# Patient Record
Sex: Female | Born: 1955 | Race: White | Hispanic: No | Marital: Married | Smoking: Former smoker
Health system: Southern US, Academic
[De-identification: ages and names within clinical notes are randomized; demographics above are authoritative.]

## PROBLEM LIST (undated history)

## (undated) DIAGNOSIS — J189 Pneumonia, unspecified organism: Secondary | ICD-10-CM

## (undated) DIAGNOSIS — Z889 Allergy status to unspecified drugs, medicaments and biological substances status: Secondary | ICD-10-CM

## (undated) DIAGNOSIS — E079 Disorder of thyroid, unspecified: Secondary | ICD-10-CM

## (undated) DIAGNOSIS — D242 Benign neoplasm of left breast: Secondary | ICD-10-CM

## (undated) DIAGNOSIS — H6690 Otitis media, unspecified, unspecified ear: Secondary | ICD-10-CM

## (undated) DIAGNOSIS — I7300 Raynaud's syndrome without gangrene: Secondary | ICD-10-CM

## (undated) DIAGNOSIS — E063 Autoimmune thyroiditis: Secondary | ICD-10-CM

## (undated) DIAGNOSIS — D8940 Mast cell activation, unspecified: Secondary | ICD-10-CM

## (undated) DIAGNOSIS — K5730 Diverticulosis of large intestine without perforation or abscess without bleeding: Secondary | ICD-10-CM

## (undated) DIAGNOSIS — L511 Stevens-Johnson syndrome: Secondary | ICD-10-CM

## (undated) DIAGNOSIS — J309 Allergic rhinitis, unspecified: Secondary | ICD-10-CM

## (undated) DIAGNOSIS — Z8709 Personal history of other diseases of the respiratory system: Secondary | ICD-10-CM

## (undated) DIAGNOSIS — H1045 Other chronic allergic conjunctivitis: Secondary | ICD-10-CM

## (undated) DIAGNOSIS — R519 Headache, unspecified: Secondary | ICD-10-CM

## (undated) DIAGNOSIS — Z91018 Allergy to other foods: Secondary | ICD-10-CM

## (undated) DIAGNOSIS — G4730 Sleep apnea, unspecified: Secondary | ICD-10-CM

## (undated) DIAGNOSIS — H8110 Benign paroxysmal vertigo, unspecified ear: Secondary | ICD-10-CM

## (undated) DIAGNOSIS — K5792 Diverticulitis of intestine, part unspecified, without perforation or abscess without bleeding: Secondary | ICD-10-CM

## (undated) HISTORY — DX: Other chronic allergic conjunctivitis: H10.45

## (undated) HISTORY — DX: Otitis media, unspecified, unspecified ear: H66.90

## (undated) HISTORY — DX: Diverticulosis of large intestine without perforation or abscess without bleeding: K57.30

## (undated) HISTORY — DX: Allergy to other foods: Z91.018

## (undated) HISTORY — DX: Personal history of other diseases of the respiratory system: Z87.09

## (undated) HISTORY — DX: Sleep apnea, unspecified: G47.30

## (undated) HISTORY — DX: Disorder of thyroid, unspecified: E07.9

## (undated) HISTORY — DX: Mast cell activation, unspecified: D89.40

## (undated) HISTORY — PX: HX BREAST BIOPSY: SHX20

## (undated) HISTORY — DX: Allergy status to unspecified drugs, medicaments and biological substances: Z88.9

## (undated) HISTORY — DX: Benign neoplasm of left breast: D24.2

## (undated) HISTORY — DX: Autoimmune thyroiditis: E06.3

## (undated) HISTORY — DX: Allergic rhinitis, unspecified: J30.9

## (undated) HISTORY — DX: Headache, unspecified: R51.9

## (undated) HISTORY — DX: Pneumonia, unspecified organism: J18.9

## (undated) HISTORY — DX: Stevens-Johnson syndrome: L51.1

## (undated) HISTORY — DX: Benign paroxysmal vertigo, unspecified ear: H81.10

## (undated) HISTORY — DX: Raynaud's syndrome without gangrene: I73.00

## (undated) HISTORY — PX: TONSILLECTOMY: SUR1361

---
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
## 1980-09-02 HISTORY — PX: HX DILATION AND CURETTAGE: SHX78

## 2000-10-17 ENCOUNTER — Ambulatory Visit (INDEPENDENT_AMBULATORY_CARE_PROVIDER_SITE_OTHER): Payer: Self-pay

## 2001-12-09 ENCOUNTER — Ambulatory Visit (HOSPITAL_COMMUNITY): Payer: Self-pay | Admitting: "Endocrinology

## 2004-01-31 ENCOUNTER — Ambulatory Visit (INDEPENDENT_AMBULATORY_CARE_PROVIDER_SITE_OTHER): Payer: Self-pay | Admitting: Otolaryngology

## 2004-07-18 ENCOUNTER — Ambulatory Visit (HOSPITAL_COMMUNITY): Payer: Self-pay

## 2010-05-22 ENCOUNTER — Encounter: Admission: RE | Admit: 2010-05-22 | Discharge: 2010-05-22 | Payer: Self-pay | Admitting: Family Medicine

## 2010-08-01 ENCOUNTER — Ambulatory Visit: Payer: Self-pay | Admitting: Diagnostic Radiology

## 2010-08-01 ENCOUNTER — Emergency Department (HOSPITAL_BASED_OUTPATIENT_CLINIC_OR_DEPARTMENT_OTHER): Admission: EM | Admit: 2010-08-01 | Discharge: 2010-08-01 | Payer: Self-pay | Admitting: Emergency Medicine

## 2010-11-14 LAB — URINALYSIS, ROUTINE W REFLEX MICROSCOPIC
Bilirubin Urine: NEGATIVE
Glucose, UA: NEGATIVE mg/dL
Hgb urine dipstick: NEGATIVE
Ketones, ur: NEGATIVE mg/dL
Nitrite: NEGATIVE
Specific Gravity, Urine: 1.004 — ABNORMAL LOW (ref 1.005–1.030)
pH: 6 (ref 5.0–8.0)

## 2010-11-14 LAB — CULTURE, BLOOD (ROUTINE X 2)
Culture  Setup Time: 201111301823
Culture: NO GROWTH

## 2010-11-14 LAB — CBC
MCH: 27.2 pg (ref 26.0–34.0)
MCV: 81.8 fL (ref 78.0–100.0)
Platelets: 396 10*3/uL (ref 150–400)
RBC: 4.65 MIL/uL (ref 3.87–5.11)
RDW: 13.8 % (ref 11.5–15.5)

## 2010-11-14 LAB — DIFFERENTIAL
Basophils Absolute: 0 10*3/uL (ref 0.0–0.1)
Basophils Relative: 0 % (ref 0–1)
Eosinophils Absolute: 0.1 10*3/uL (ref 0.0–0.7)
Eosinophils Relative: 1 % (ref 0–5)
Lymphs Abs: 1.2 10*3/uL (ref 0.7–4.0)
Neutrophils Relative %: 76 % (ref 43–77)

## 2010-11-14 LAB — BASIC METABOLIC PANEL
BUN: 9 mg/dL (ref 6–23)
CO2: 23 mEq/L (ref 19–32)
Calcium: 9.5 mg/dL (ref 8.4–10.5)
Chloride: 105 mEq/L (ref 96–112)
Creatinine, Ser: 0.8 mg/dL (ref 0.4–1.2)
GFR calc Af Amer: >60 mL/min (ref >60)

## 2011-02-20 ENCOUNTER — Emergency Department (INDEPENDENT_AMBULATORY_CARE_PROVIDER_SITE_OTHER)

## 2011-02-20 ENCOUNTER — Emergency Department (HOSPITAL_BASED_OUTPATIENT_CLINIC_OR_DEPARTMENT_OTHER)
Admission: EM | Admit: 2011-02-20 | Discharge: 2011-02-21 | Disposition: A | Discharge status: Other Acute Inpatient Hospital | Source: Home / Self Care | Attending: Emergency Medicine | Admitting: Emergency Medicine

## 2011-02-20 DIAGNOSIS — Z01810 Encounter for preprocedural cardiovascular examination: Secondary | ICD-10-CM | POA: Insufficient documentation

## 2011-02-20 DIAGNOSIS — Z01818 Encounter for other preprocedural examination: Secondary | ICD-10-CM | POA: Insufficient documentation

## 2011-02-20 DIAGNOSIS — E039 Hypothyroidism, unspecified: Secondary | ICD-10-CM | POA: Insufficient documentation

## 2011-02-20 DIAGNOSIS — R079 Chest pain, unspecified: Principal | ICD-10-CM | POA: Insufficient documentation

## 2011-02-20 DIAGNOSIS — Z01812 Encounter for preprocedural laboratory examination: Secondary | ICD-10-CM | POA: Insufficient documentation

## 2011-02-20 LAB — COMPREHENSIVE METABOLIC PANEL
Albumin: 4.5 g/dL (ref 3.5–5.2)
BUN: 15 mg/dL (ref 6–23)
CO2: 25 mEq/L (ref 19–32)
Calcium: 9.5 mg/dL (ref 8.4–10.5)
Chloride: 102 mEq/L (ref 96–112)
Creatinine, Ser: 0.7 mg/dL (ref 0.50–1.10)
GFR calc non Af Amer: >60 mL/min (ref >60)
Total Bilirubin: 0.4 mg/dL (ref 0.3–1.2)

## 2011-02-20 LAB — CK TOTAL AND CKMB (NOT AT ARMC)
Relative Index: INVALID (ref 0.0–2.5)
Total CK: 69 U/L (ref 7–177)

## 2011-02-20 LAB — CBC
MCV: 83.3 fL (ref 78.0–100.0)
Platelets: 295 10*3/uL (ref 150–400)
RBC: 4.49 MIL/uL (ref 3.87–5.11)
RDW: 14.2 % (ref 11.5–15.5)
WBC: 5.8 10*3/uL (ref 4.0–10.5)

## 2011-02-20 LAB — URINALYSIS, ROUTINE W REFLEX MICROSCOPIC
Leukocytes, UA: NEGATIVE
Protein, ur: NEGATIVE mg/dL
Specific Gravity, Urine: 1.008 (ref 1.005–1.030)
Urobilinogen, UA: 0.2 mg/dL (ref 0.0–1.0)

## 2011-02-21 ENCOUNTER — Observation Stay (HOSPITAL_COMMUNITY)
Admission: AD | Admit: 2011-02-21 | Discharge: 2011-02-22 | Disposition: A | Discharge status: Home / Self Care | Source: Other Acute Inpatient Hospital | Attending: Internal Medicine | Admitting: Internal Medicine

## 2011-02-21 DIAGNOSIS — R079 Chest pain, unspecified: Secondary | ICD-10-CM

## 2011-02-21 LAB — COMPREHENSIVE METABOLIC PANEL
AST: 14 U/L (ref 0–37)
Albumin: 3.4 g/dL — ABNORMAL LOW (ref 3.5–5.2)
Alkaline Phosphatase: 44 U/L (ref 39–117)
Chloride: 106 mEq/L (ref 96–112)
Creatinine, Ser: 0.6 mg/dL (ref 0.50–1.10)
Potassium: 3.6 mEq/L (ref 3.5–5.1)
Sodium: 139 mEq/L (ref 135–145)
Total Bilirubin: 0.6 mg/dL (ref 0.3–1.2)

## 2011-02-21 LAB — LIPID PANEL
LDL Cholesterol: 79 mg/dL (ref 0–99)
VLDL: 11 mg/dL (ref 0–40)

## 2011-02-21 LAB — CBC
Platelets: 264 10*3/uL (ref 150–400)
RDW: 14 % (ref 11.5–15.5)
WBC: 5.9 10*3/uL (ref 4.0–10.5)

## 2011-02-21 LAB — D-DIMER, QUANTITATIVE: D-Dimer, Quant: 0.46 ug/mL-FEU (ref 0.00–0.48)

## 2011-02-21 LAB — CARDIAC PANEL(CRET KIN+CKTOT+MB+TROPI)
CK, MB: 1.6 ng/mL (ref 0.3–4.0)
CK, MB: 1.5 ng/mL (ref 0.3–4.0)
Total CK: 52 U/L (ref 7–177)
Troponin I: <0.3 ng/mL (ref <0.30)

## 2011-02-21 LAB — MAGNESIUM: Magnesium: 2 mg/dL (ref 1.5–2.5)

## 2011-02-22 ENCOUNTER — Observation Stay (HOSPITAL_COMMUNITY)

## 2011-02-22 MED ORDER — TECHNETIUM TC 99M TETROFOSMIN IV KIT
30.0000 | PACK | Freq: Once | INTRAVENOUS | Status: AC | PRN
  Administered 2011-02-22: 30 via INTRAVENOUS

## 2011-02-22 MED ORDER — TECHNETIUM TC 99M TETROFOSMIN IV KIT
10.0000 | PACK | Freq: Once | INTRAVENOUS | Status: AC | PRN
  Administered 2011-02-22: 10 via INTRAVENOUS

## 2011-02-23 ENCOUNTER — Other Ambulatory Visit (HOSPITAL_COMMUNITY)

## 2011-02-24 NOTE — Consult Note (Signed)
NAME:  Mallory Gross, Mallory Gross NO.:  [id number]  MEDICAL RECORD NO.:  [id number]  LOCATION:  2004                         FACILITY:  [facility]  PHYSICIAN:  Hillis Range, MD       DATE OF BIRTH:  September 21, 1955  DATE OF CONSULTATION: DATE OF DISCHARGE:                                CONSULTATION   REQUESTING PHYSICIAN:  [facility], Dr. Rito Ehrlich.  REASON FOR CONSULTATION:  Chest pain.  HISTORY OF PRESENT ILLNESS:  Ms. Menser is a pleasant 55 year old female with a history of sleep apnea and thyroid dysfunction who now presents for further evaluation and management of chest discomfort.  She reports being in her usual state of health until this past Sunday when she developed nausea which persisted throughout the day.  She subsequently developed mild left arm discomfort which was followed by intermittent episodes of pressure-like chest discomfort.  She reports that episodes have occurred intermittently since that time typically lasting 20-30 minutes.  She finds that when she was doing housework such as making her bed pain was slightly worsened but does not feel that it is completely resolved with rest.  She denies associated shortness of breath, palpitations, dizziness, presyncope, or syncope.  She was brought to [facility] where she has been observed.  Her cardiac markers have been negative and her EKG has not revealed evidence of ischemia. Presently, she is resting comfortably and is without complaint.  PAST MEDICAL HISTORY: 1. Obesity. 2. 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 3. Obstructive sleep apnea.  PAST SURGICAL HISTORY:  Tonsillectomy.  MEDICATIONS:  Reviewed in the [facility].  ALLERGIES:  She has multiple allergies which include PENICILLIN, SULFA, TAMOXIFEN, AMPICILLIN, CEPHALOSPORINS, ERYTHROMYCIN and LEVAQUIN.  SOCIAL HISTORY:  The patient recently moved to [place].  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  She denies tobacco,  alcohol or drug use.  FAMILY HISTORY:  The patient's mother had Takotsubo cardiomyopathy and hypertension.  REVIEW OF SYSTEMS:  All systems were reviewed and negative except as outlined in the HPI above.  PHYSICAL EXAMINATION:  Telemetry reveals sinus rhythm. VITAL SIGNS:  Blood pressure 116/76, heart rate 75, respirations 20, sats 99% on room air, afebrile. GENERAL:  The patient is a well-appearing female in no acute distress. She is alert and oriented x3. HEENT:  Normocephalic, atraumatic.  Sclerae clear.  Conjunctivae pink. Oropharynx clear. NECK:  Supple.  No thyromegaly, JVD or bruits. LUNGS:  Clear to auscultation bilaterally. HEART:  Regular rate and rhythm.  No murmurs, rubs or gallops. GI:  Soft, nontender, nondistended.  Positive bowel sounds. EXTREMITIES:  No clubbing, cyanosis or edema. SKIN:  No ecchymoses or lacerations. MUSCULOSKELETAL:  No deformity or atrophy. PSYCH:  Euthymic mood.  Full affect.  EKG today reveals sinus rhythm at 70 beats per minute with no ischemic changes and is otherwise unremarkable.  LABORATORY DATA:  Cardiac markers are negative x3.  D-dimer 0.46.  TSH 1.2.  Magnesium 2.0, potassium 3.6, creatinine 0.6.  Total cholesterol 147, HDL 57, triglycerides 54, LDL 79.  Chest x-ray reveals no acute airspace disease.  IMPRESSION:  Ms. Pollak is a very pleasant 55 year old female with no significant coronary risk factors who now presents for further  evaluation and management of chest pain.  Her lipid profile is remarkably normal.  She has no history of tobacco, hypertension, diabetes and no significant family history of coronary artery disease. Her cardiac markers have been normal and her EKG is without evidence of ischemia at this time.  I had a long discussion with the patient regarding our strategy going forward to evaluate her chest discomfort. She has both typical and atypical features.  I therefore offered the options of stress testing or  proceeding directly to left heart catheterization.  The patient understands risks and benefits of both procedures and is quite clear that she wishes to avoid left heart catheterization if possible and therefore would like to have a stress test performed.  I think that this is a reasonable option.  We will therefore schedule her for a Lexiscan Myoview in the morning.  If her Steffanie Dunn is low risk, then she could be treated medically, however, if it is high risk, then she would require left heart catheterization.  In the interim, we will continue the patient on aspirin.  I think that given her recent nausea, we should continue her PPI and also try a GI cocktail to potentially treat gastrointestinal causes for her chest discomfort.  We will follow her closely with you during her hospital stay.     Hillis Range, MD     JA/MEDQ  D:  02/21/2011  T:  02/22/2011  Job:  [id number]  cc:   Dr. Rito Ehrlich  Electronically Signed by Hillis Range MD on 02/24/2011 04:50:24 PM

## 2011-02-25 NOTE — H&P (Signed)
Mallory Gross, CANCRO NO.:  [id number]  MEDICAL RECORD NO.:  [id number]  LOCATION:  2004                         FACILITY:  [facility]  PHYSICIAN:  Eduard Clos, MDDATE OF BIRTH:  29-Aug-1956  DATE OF ADMISSION:  02/21/2011 DATE OF DISCHARGE:                             HISTORY & PHYSICAL   PRIMARY CARE PHYSICIAN:  Dr. Earnest Bailey.  CHIEF COMPLAINT:  Chest pain.  HISTORY OF PRESENT ILLNESS:  A 55 year old female with known history of hypothyroidism has been experiencing some nausea and chest pain over the last 3-4 days which has been becoming more often.  The chest pain is retrosternal, pressure-like with some nausea, lasts around 20-30 minutes, happens multiple times in a day.  The patient came to the ER, in the ER the patient had cardiac enzymes and chest x-ray which were negative.  EKG also was negative.  The patient has been admitted for further observation.  The patient has developed some headache after nitroglycerin which we will discontinue at this time.  Denies any dizziness, loss of consciousness.  Denies any abdominal pain, dysuria, discharge, or diarrhea.  Denies any focal deficit.  PAST MEDICAL HISTORY:  Hypothyroidism.  PAST SURGICAL HISTORY:  Tonsillectomy.  MEDICATIONS PRIOR TO ADMISSION:  Synthroid.  FAMILY HISTORY:  Significant for mom having takotsubo cardiomyopathy.  ALLERGIES:  The patient is allergic to PENICILLIN, SULFA, TAMOXIFEN, AMPICILLIN, CEPHALOSPORIN, CYCLOSPORIN, ERYTHROMYCIN, LEVAQUIN.  SOCIAL HISTORY:  The patient has own business.  Denies smoking cigarettes, drinking alcohol, using illegal drugs.  REVIEW OF SYSTEMS:  As per history of present illness nothing else significant.  PHYSICAL EXAMINATION:  GENERAL:  The patient is examined at bedside not in acute distress. VITAL SIGNS:  Blood pressure 131/90, pulse 56 per minute, temperature 98.1, respirations 18 per minute, O2 sat 99%. HEENT:  Anicteric.  No pallor.  No  discharge from ears, eyes, nose or mouth. CHEST:  Bilateral air entry present.  No rhonchi.  No crepitation. HEART:  S1 and S2 heard. ABDOMEN:  Soft, nontender.  Bowel sounds heard. CNS:  The patient is alert, awake and oriented to time, place and person.  Moves upper and lower extremities 5/5. EXTREMITIES:  Peripheral pulses are felt.  No edema.  LABS:  Last chest x-ray shows no active cardiopulmonary abnormalities. CBC; WBC is 5.8, hemoglobin is 12.5, hematocrit is 37.4, platelets 295. Complete metabolic panel; sodium 140, potassium 3.6, chloride 102, carbon dioxide than 25, glucose 94, BUN 15, creatinine 0.7, total bilirubin is 0.4, alkaline phosphatase 51, AST 16, ALT 15, total protein 7.2, albumin 4.5, calcium 9.5, CK is 69, CK-MB 20.7, troponin-I less than 0.3.  UA is negative for nitrite, leukocyte, negative for glucose, ketones 15.  ASSESSMENT: 1. Chest pain, rule out acute coronary syndrome. 2. History of hypothyroidism.  PLAN:  At this time we will admit the patient to telemetry.  We will cycle her cardiac markers, we will check D-dimer and also lipase level. We will place the patient on aspirin, hydrocodone p.r.n., get a 2-D echo and further recommendation as condition evolves.     Eduard Clos, MD     ANK/MEDQ  D:  02/21/2011  T:  02/21/2011  Job:  [id number]  cc:   Dr. Earnest Bailey  Electronically Signed by Midge Minium MD on 02/25/2011 07:37:54 AM

## 2011-03-06 NOTE — Discharge Summary (Signed)
  NAME:  Mallory Gross, Mallory Gross NO.:  [id number]  MEDICAL RECORD NO.:  [id number]  LOCATION:  2004                         FACILITY:  [facility]  PHYSICIAN:  Osvaldo Shipper, MD     DATE OF BIRTH:  [birth year]  DATE OF ADMISSION:  02/21/2011 DATE OF DISCHARGE:  02/22/2011                              DISCHARGE SUMMARY   PRIMARY MEDICAL DOCTOR:  Tracey Harries, MD  CONSULTATION DURING THIS ADMISSION:  Hillis Range, MD with [facility].  IMAGING STUDIES DONE DURING THIS ADMISSION: 1. Chest x-ray which showed no active cardiopulmonary disease. 2. The patient had a stress test, which showed normal EF of 75%     without any wall motion abnormalities.  No reversible ischemia,     soft tissue attenuation in the anterior inferior wall was noted,     but this thought to be either secondary to diaphragmatic     attenuation or due to breast.  DISCHARGE DIAGNOSES: 1. Chest pain, etiology unclear. 2. Ruled out for acute coronary syndrome. 3. Negative stress test. 4. Possible gastroesophageal reflux disease, so initiated proton pump     inhibitors. 5. History of hypothyroidism, stable.  BRIEF HOSPITAL COURSE:  Briefly, this is a 55 year old Caucasian female who presented to the hospital complaining of chest pain.  She described it as a discomfort in the chest, which was worsening with exertion.  She ruled out for acute coronary syndrome.  She was then seen by cardiology and underwent stress test, which is a negative stress test.  Reason for her chest pain is unclear.  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  That may be playing a role.  It is possible that acid reflux maybe playing a role, so I initiated omeprazole in this patient.  I have asked her to follow up with her primary care physician in a week, and if the omeprazole does not seem to be helping, she may consider stopping it.  Rest of her medical issues are all  stable.  DISCHARGE MEDICATIONS: 1. Synthroid 50 mcg p.o. daily. 2. Flonase nasal spray 2 sprays intranasally daily. 3. Cleocin topically to her face daily, this is for acne. 4. Aspirin 325 mg as needed as before. 5. Afrin 1 spray nasally nightly. 6. Fish oil 1 tablet p.o. daily. 7. Omeprazole 20 mg p.o. daily.  Follow up with PCP in 1 week.  DIET:  Heart-healthy physical activity as before.  No exertion.  TOTAL TIME ON THIS DISCHARGE ENCOUNTER:  35 minutes.     Osvaldo Shipper, MD     GK/MEDQ  D:  02/22/2011  T:  02/23/2011  Job:  [id number]  cc:   Hillis Range, MD Vesta Mixer, M.D. Tracey Harries, M.D.  Electronically Signed by Osvaldo Shipper MD on 03/06/2011 07:41:20 PM

## 2011-08-22 ENCOUNTER — Emergency Department (HOSPITAL_BASED_OUTPATIENT_CLINIC_OR_DEPARTMENT_OTHER)
Admission: EM | Admit: 2011-08-22 | Discharge: 2011-08-22 | Disposition: A | Discharge status: Home / Self Care | Attending: Emergency Medicine | Admitting: Emergency Medicine

## 2011-08-22 ENCOUNTER — Encounter: Payer: Self-pay | Admitting: *Deleted

## 2011-08-22 DIAGNOSIS — R109 Unspecified abdominal pain: Secondary | ICD-10-CM | POA: Insufficient documentation

## 2011-08-22 DIAGNOSIS — G4730 Sleep apnea, unspecified: Secondary | ICD-10-CM | POA: Insufficient documentation

## 2011-08-22 DIAGNOSIS — E079 Disorder of thyroid, unspecified: Secondary | ICD-10-CM | POA: Insufficient documentation

## 2011-08-22 DIAGNOSIS — N83209 Unspecified ovarian cyst, unspecified side: Secondary | ICD-10-CM | POA: Insufficient documentation

## 2011-08-22 HISTORY — DX: Sleep apnea, unspecified: G47.30

## 2011-08-22 HISTORY — DX: Disorder of thyroid, unspecified: E07.9

## 2011-08-22 HISTORY — DX: Diverticulitis of intestine, part unspecified, without perforation or abscess without bleeding: K57.92

## 2011-08-22 LAB — URINALYSIS, ROUTINE W REFLEX MICROSCOPIC
Glucose, UA: NEGATIVE mg/dL
Ketones, ur: NEGATIVE mg/dL
Leukocytes, UA: NEGATIVE
Protein, ur: NEGATIVE mg/dL
pH: 5.5 (ref 5.0–8.0)

## 2011-08-22 LAB — URINE MICROSCOPIC-ADD ON

## 2011-08-22 NOTE — ED Provider Notes (Signed)
History     CSN: [id number] Arrival date & time: 08/22/2011  5:47 AM   First MD Initiated Contact with Patient 08/22/11 [phone]      Chief Complaint  Patient presents with  . Abdominal Pain    (Consider location/radiation/quality/duration/timing/severity/associated sxs/prior treatment) Patient is a 55 y.o. female presenting with abdominal pain. The history is provided by the patient.  Abdominal Pain The primary symptoms of the illness include abdominal pain and vaginal bleeding. The primary symptoms of the illness do not include fever, nausea, vomiting, diarrhea or vaginal discharge. Episode onset: 4 days ago. The onset of the illness was sudden. The problem has been gradually improving.  The pain came on suddenly. The abdominal pain has been gradually improving since its onset. Pain Location: Worse in the left pelvic area. The abdominal pain radiates to the suprapubic region. The severity of the abdominal pain is 5/10. The abdominal pain is relieved by nothing. Exacerbated by: Nothing.  Vaginal bleeding was first noticed more than 2 days ago. Vaginal bleeding other than menses is a new problem. Vaginal bleeding is improving since it began. The quantity of blood was lighter than menses.  The patient states that she believes she is currently not pregnant. The patient has not had a change in bowel habit. Additional symptoms associated with the illness include frequency. Symptoms associated with the illness do not include anorexia, constipation, urgency or back pain.    Past Medical History  Diagnosis Date  . Diverticulitis   . Sleep apnea   . Thyroid disease     Past Surgical History  Procedure Date  . Tonsillectomy     History reviewed. No pertinent family history.  History  Substance Use Topics  . Smoking status: Never Smoker   . Smokeless tobacco: Not on file  . Alcohol Use: Yes    OB History    Grav Para Term Preterm Abortions TAB SAB Ect Mult Living                   Review of Systems  Constitutional: Negative for fever.  Gastrointestinal: Positive for abdominal pain. Negative for nausea, vomiting, diarrhea, constipation and anorexia.  Genitourinary: Positive for frequency and vaginal bleeding. Negative for urgency and vaginal discharge.  Musculoskeletal: Negative for back pain.  All other systems reviewed and are negative.    Allergies  Erythromycin; Keflex; Nitroglycerin; Penicillins; and Sulfa antibiotics  Home Medications  No current outpatient prescriptions on file.  BP 143/79  Pulse 61  Temp(Src) 98.1 F (36.7 C) (Oral)  Resp 18  Ht 5\' 6"  (1.676 m)  Wt 185 lb (83.915 kg)  BMI 29.86 kg/m2  SpO2 100%  LMP 08/05/2011  Physical Exam  Nursing note and vitals reviewed. Constitutional: She is oriented to person, place, and time. She appears well-developed and well-nourished. No distress.  HENT:  Head: Normocephalic and atraumatic.  Eyes: EOM are normal. Pupils are equal, round, and reactive to light.  Cardiovascular: Normal rate, regular rhythm, normal heart sounds and intact distal pulses.  Exam reveals no friction rub.   No murmur heard. Pulmonary/Chest: Effort normal and breath sounds normal. She has no wheezes. She has no rales.  Abdominal: Soft. Bowel sounds are normal. She exhibits no distension. There is tenderness. There is no rebound, no guarding and no CVA tenderness.    Genitourinary: Vagina normal and uterus normal. Cervix exhibits no motion tenderness and no discharge. Right adnexum displays tenderness. Right adnexum displays no mass and no fullness. Left adnexum displays tenderness. Left  adnexum displays no mass and no fullness.  Musculoskeletal: Normal range of motion. She exhibits no tenderness.       No edema  Neurological: She is alert and oriented to person, place, and time. No cranial nerve deficit.  Skin: Skin is warm and dry. No rash noted.  Psychiatric: She has a normal mood and affect. Her behavior is  normal.    ED Course  Procedures (including critical care time)   Labs Reviewed  URINALYSIS, ROUTINE W REFLEX MICROSCOPIC  PREGNANCY, URINE   No results found.   No diagnosis found.    MDM   Patient with symptoms most consistent with ruptured ovarian cyst. 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 5 days ago patient had sharp excruciating left pelvic pain which doubled her over and since that time has had persistent pelvic pain now more of a nagging pain. She also started her menses 2 days ago with heavy bleeding. She states she always has heavy vaginal bleeding but it's unusual for her to having midcycle bleeding. She states her periods have still been very regular without many symptoms of menopause. She denies any nausea or vomiting and states she just doesn't feel good. She denies any GI symptoms and denies any history of symptoms suggestive of STD. Doubt diverticulitis, pyelonephritis, GI pathology. Most likely the patient has a ruptured ovarian cyst. On pelvic exam no cervical motion tenderness or ovarian mass.  tenderness over the right and left adnexa. Will check UA and UPT to rule out urinary pathology.   7:16 AM UPT negative UA within normal limits. Most likely a ruptured ovarian cyst and will discharge him and have her follow up with her OB/GYN if pain persists for ultrasound.     Gwyneth Sprout, MD 08/22/11 [phone]

## 2011-08-22 NOTE — ED Notes (Signed)
C/o abd pain that woke from sleep with mid-cycle vaginal bleeding. Pt states that she felt nauseous and "buzzy" after waking. Denies any CP or SOB.

## 2011-08-22 NOTE — ED Notes (Signed)
Pt states she woke from sleep with lower abd pain that radiates to left flank area. Also c/o vaginal bleeding and nausea.

## 2011-09-09 ENCOUNTER — Other Ambulatory Visit: Payer: Self-pay | Admitting: Family Medicine

## 2011-09-09 DIAGNOSIS — Z1231 Encounter for screening mammogram for malignant neoplasm of breast: Secondary | ICD-10-CM

## 2011-09-25 ENCOUNTER — Ambulatory Visit

## 2011-10-03 ENCOUNTER — Ambulatory Visit
Admission: RE | Admit: 2011-10-03 | Discharge: 2011-10-03 | Disposition: A | Discharge status: Home / Self Care | Source: Ambulatory Visit | Attending: Family Medicine | Admitting: Family Medicine

## 2011-10-03 DIAGNOSIS — Z1231 Encounter for screening mammogram for malignant neoplasm of breast: Secondary | ICD-10-CM

## 2012-05-13 IMAGING — CR DG CHEST 2V
2 series · 2 of 2 positions shown · non-contrast
Comparison: 08/01/2010

CLINICAL DATA: Chest pain

CHEST - 2 VIEW

[w chest pa]
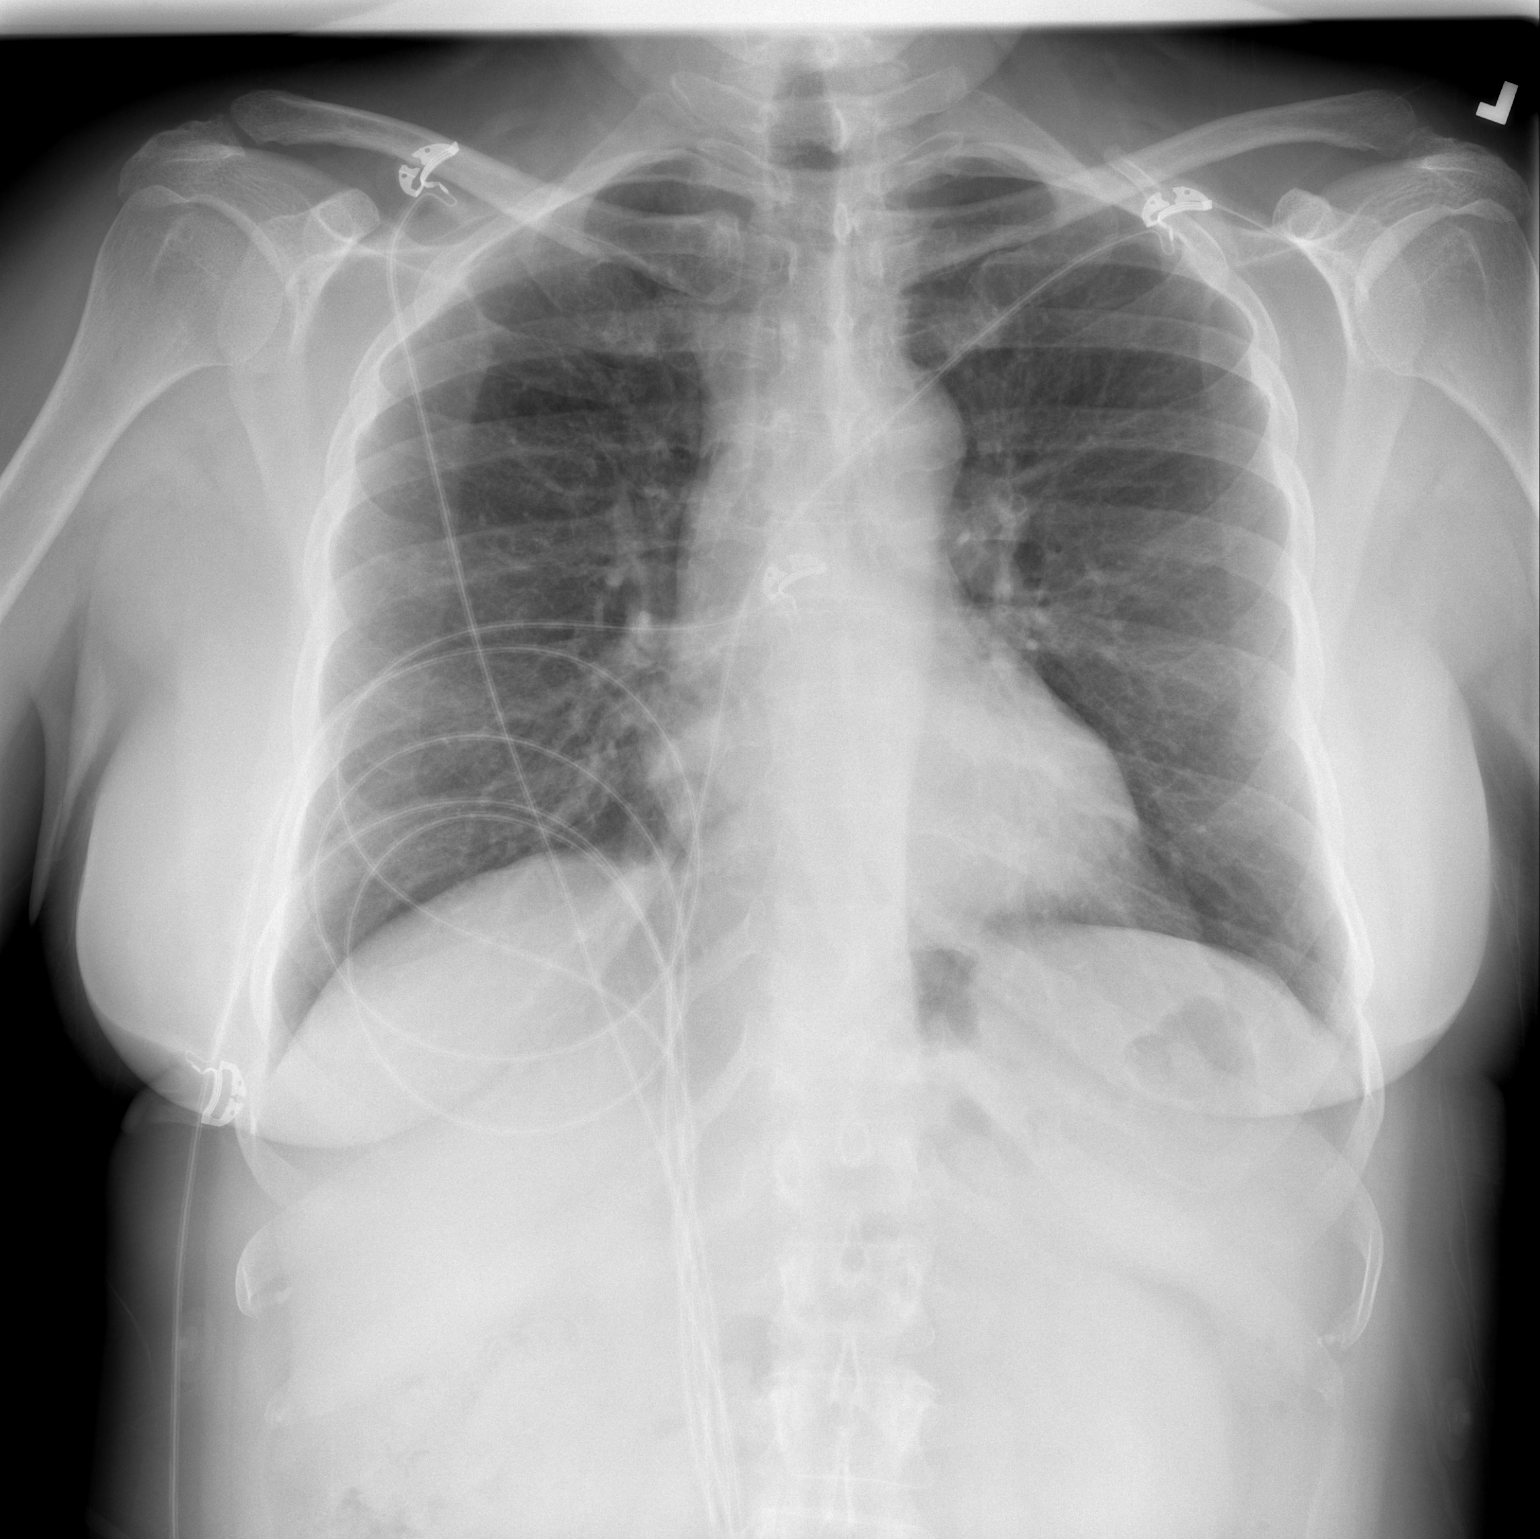

[w chest lat]
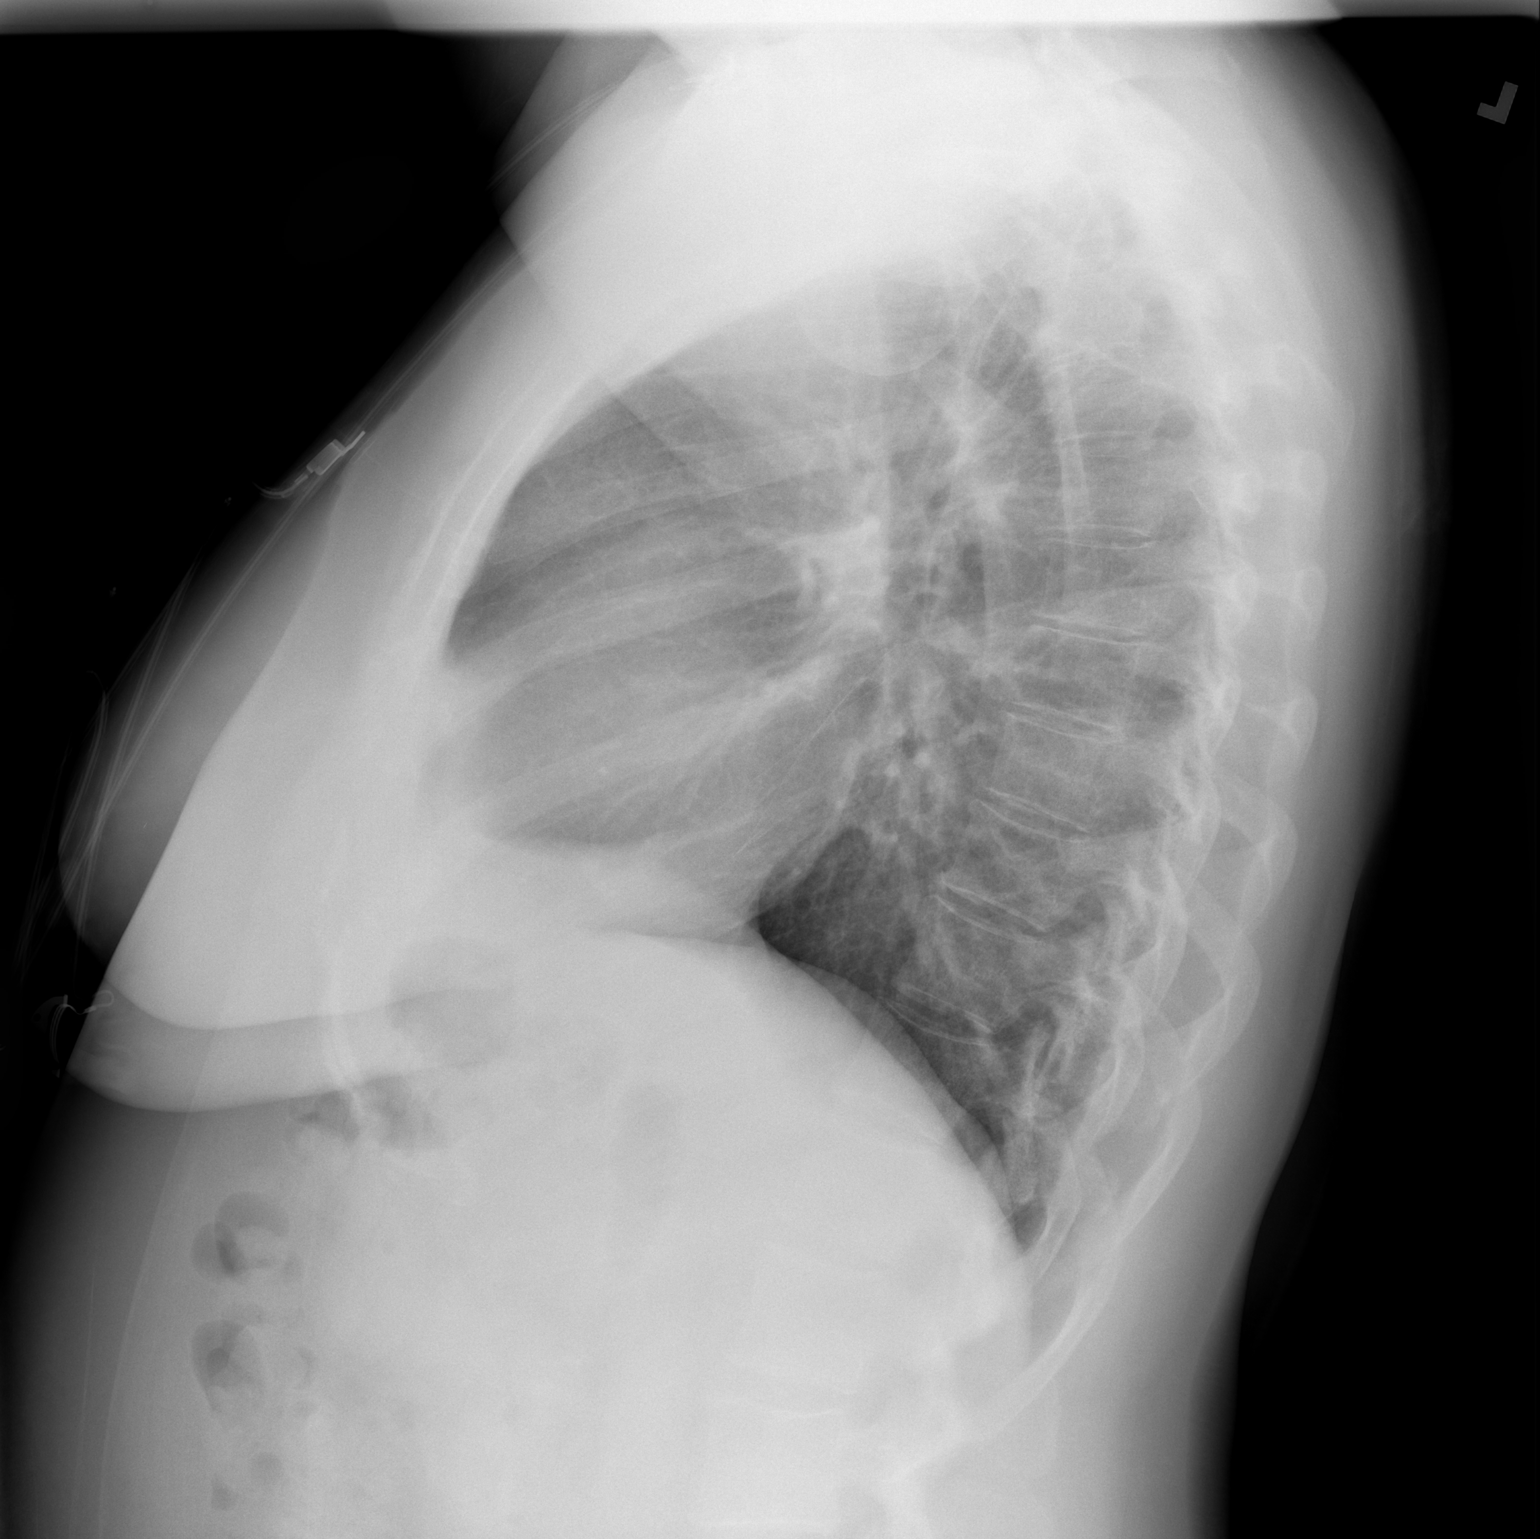

[2 of 2 positions shown; findings below may reference images not displayed]

FINDINGS: Heart size and mediastinal contours appear normal.

No pleural effusion or pulmonary edema identified.

No airspace consolidation noted.

Review of the visualized osseous structures is negative.
IMPRESSION: 1.  No active cardiopulmonary abnormalities.

## 2012-07-28 ENCOUNTER — Other Ambulatory Visit: Payer: Self-pay | Admitting: Family Medicine

## 2012-07-28 ENCOUNTER — Other Ambulatory Visit: Payer: Self-pay | Admitting: Nurse Practitioner

## 2012-07-28 DIAGNOSIS — N644 Mastodynia: Secondary | ICD-10-CM

## 2012-08-05 ENCOUNTER — Ambulatory Visit
Admission: RE | Admit: 2012-08-05 | Discharge: 2012-08-05 | Disposition: A | Discharge status: Home / Self Care | Source: Ambulatory Visit | Attending: Nurse Practitioner | Admitting: Nurse Practitioner

## 2012-08-05 ENCOUNTER — Other Ambulatory Visit: Payer: Self-pay | Admitting: Nurse Practitioner

## 2012-08-05 DIAGNOSIS — N644 Mastodynia: Secondary | ICD-10-CM

## 2013-08-16 ENCOUNTER — Emergency Department (HOSPITAL_BASED_OUTPATIENT_CLINIC_OR_DEPARTMENT_OTHER)
Admission: EM | Admit: 2013-08-16 | Discharge: 2013-08-17 | Disposition: A | Discharge status: Home / Self Care | Attending: Emergency Medicine | Admitting: Emergency Medicine

## 2013-08-16 ENCOUNTER — Encounter (HOSPITAL_BASED_OUTPATIENT_CLINIC_OR_DEPARTMENT_OTHER): Payer: Self-pay | Admitting: Emergency Medicine

## 2013-08-16 DIAGNOSIS — B349 Viral infection, unspecified: Secondary | ICD-10-CM

## 2013-08-16 DIAGNOSIS — Z8719 Personal history of other diseases of the digestive system: Secondary | ICD-10-CM | POA: Insufficient documentation

## 2013-08-16 DIAGNOSIS — R51 Headache: Secondary | ICD-10-CM | POA: Insufficient documentation

## 2013-08-16 DIAGNOSIS — E079 Disorder of thyroid, unspecified: Secondary | ICD-10-CM | POA: Insufficient documentation

## 2013-08-16 DIAGNOSIS — Z79899 Other long term (current) drug therapy: Secondary | ICD-10-CM | POA: Insufficient documentation

## 2013-08-16 DIAGNOSIS — B9789 Other viral agents as the cause of diseases classified elsewhere: Secondary | ICD-10-CM | POA: Insufficient documentation

## 2013-08-16 DIAGNOSIS — M2550 Pain in unspecified joint: Secondary | ICD-10-CM | POA: Insufficient documentation

## 2013-08-16 DIAGNOSIS — Z792 Long term (current) use of antibiotics: Secondary | ICD-10-CM | POA: Insufficient documentation

## 2013-08-16 DIAGNOSIS — J029 Acute pharyngitis, unspecified: Secondary | ICD-10-CM | POA: Insufficient documentation

## 2013-08-16 DIAGNOSIS — M542 Cervicalgia: Secondary | ICD-10-CM | POA: Insufficient documentation

## 2013-08-16 DIAGNOSIS — IMO0001 Reserved for inherently not codable concepts without codable children: Secondary | ICD-10-CM | POA: Insufficient documentation

## 2013-08-16 DIAGNOSIS — H53149 Visual discomfort, unspecified: Secondary | ICD-10-CM | POA: Insufficient documentation

## 2013-08-16 LAB — CBC WITH DIFFERENTIAL/PLATELET
Basophils Absolute: 0 10*3/uL (ref 0.0–0.1)
Eosinophils Absolute: 0.1 10*3/uL (ref 0.0–0.7)
Eosinophils Relative: 2 % (ref 0–5)
Lymphs Abs: 1.9 10*3/uL (ref 0.7–4.0)
MCH: 28.3 pg (ref 26.0–34.0)
MCV: 84.8 fL (ref 78.0–100.0)
Neutrophils Relative %: 58 % (ref 43–77)
Platelets: 304 10*3/uL (ref 150–400)
RBC: 4.67 MIL/uL (ref 3.87–5.11)
RDW: 14 % (ref 11.5–15.5)
WBC: 6 10*3/uL (ref 4.0–10.5)

## 2013-08-16 NOTE — ED Notes (Signed)
C/o "just not feeling well for the last couple of weeks"-difficult to assess specific c/o-pt states pain site HA and has been having chills

## 2013-08-16 NOTE — ED Notes (Signed)
NP at bedside.

## 2013-08-16 NOTE — ED Provider Notes (Signed)
CSN: [id number]     Arrival date & time 08/16/13  1820 History   First MD Initiated Contact with Patient 08/16/13 2221     Chief Complaint  Patient presents with  . Headache   (Consider location/radiation/quality/duration/timing/severity/associated sxs/prior Treatment) Patient is a 57 y.o. female presenting with URI. The history is provided by the patient.  URI Presenting symptoms: congestion, facial pain, fever, rhinorrhea and sore throat   Presenting symptoms: no cough and no ear pain   Severity:  Moderate Onset quality:  Gradual Duration:  2 weeks Timing:  Constant Progression:  Worsening Chronicity:  New Relieved by:  Nothing Worsened by:  Nothing tried Ineffective treatments:  OTC medications and decongestant Associated symptoms: arthralgias, headaches, myalgias, neck pain, sinus pain and swollen glands   Risk factors: sick contacts   Risk factors: no diabetes mellitus, no immunosuppression and no recent travel    Eddye Broxterman is a 57 y.o. female who presents to the ED with "just not feeling well for the past couple weeks". She reports fever and chills with headache. She had an infection in her blood 3 years ago and was hospitalized and is afraid that may happen again.  Past Medical History  Diagnosis Date  . Diverticulitis   . Sleep apnea   . Thyroid disease    Past Surgical History  Procedure Laterality Date  . Tonsillectomy     No family history on file. History  Substance Use Topics  . Smoking status: Never Smoker   . Smokeless tobacco: Not on file  . Alcohol Use: Yes   OB History   Grav Para Term Preterm Abortions TAB SAB Ect Mult Living                 Review of Systems  Constitutional: Positive for fever.  HENT: Positive for congestion, rhinorrhea and sore throat. Negative for ear pain.   Eyes: Positive for photophobia and redness. Negative for visual disturbance.  Respiratory: Negative for cough.   Gastrointestinal: Negative for vomiting, abdominal  pain and diarrhea.  Genitourinary: Negative for dysuria, urgency and frequency.  Musculoskeletal: Positive for arthralgias, myalgias and neck pain.  Skin: Negative for rash.  Neurological: Positive for light-headedness and headaches.  Psychiatric/Behavioral: Negative for confusion. The patient is not nervous/anxious.     Allergies  Amoxil; Cephalexin; Erythromycin; Nitroglycerin; Penicillins; Septra; and Sulfa antibiotics  Home Medications   Current Outpatient Rx  Name  Route  Sig  Dispense  Refill  . clindamycin (CLEOCIN T) 1 % lotion   Topical   Apply topically 2 (two) times daily.         Marland Kitchen levothyroxine (SYNTHROID, LEVOTHROID) 100 MCG tablet   Oral   Take 100 mcg by mouth daily.            BP 172/96  Pulse 87  Temp(Src) 98.4 F (36.9 C) (Oral)  Resp 16  Ht 5\' 7"  (1.702 m)  Wt 185 lb (83.915 kg)  BMI 28.97 kg/m2  SpO2 100%  LMP 07/28/2013 Physical Exam  Nursing note and vitals reviewed. Constitutional: She is oriented to person, place, and time. She appears well-developed and well-nourished. No distress.  HENT:  Head: Normocephalic and atraumatic.    Right Ear: Tympanic membrane normal.  Left Ear: Tympanic membrane normal.  Nose: Rhinorrhea present.  Mouth/Throat: Uvula is midline and mucous membranes are normal. Posterior oropharyngeal erythema present.  Tender with palpation over eyes.  Eyes: Conjunctivae and EOM are normal. Pupils are equal, round, and reactive to light.  Neck:  Neck supple.  Cardiovascular: Normal rate and regular rhythm.   Pulmonary/Chest: Effort normal and breath sounds normal.  Abdominal: Soft. Bowel sounds are normal. There is no tenderness.  Musculoskeletal: Normal range of motion.  Neurological: She is alert and oriented to person, place, and time. No cranial nerve deficit.  Skin: Skin is warm and dry.  Psychiatric: She has a normal mood and affect. Her behavior is normal.   Results for orders placed during the hospital  encounter of 08/16/13 (from the past 24 hour(s))  CBC WITH DIFFERENTIAL     Status: None   Collection Time    08/16/13 10:42 PM      Result Value Range   WBC 6.0  4.0 - 10.5 K/uL   RBC 4.67  3.87 - 5.11 MIL/uL   Hemoglobin 13.2  12.0 - 15.0 g/dL   HCT 09.8  11.9 - 14.7 %   MCV 84.8  78.0 - 100.0 fL   MCH 28.3  26.0 - 34.0 pg   MCHC 33.3  30.0 - 36.0 g/dL   RDW 82.9  56.2 - 13.0 %   Platelets 304  150 - 400 K/uL   Neutrophils Relative % 58  43 - 77 %   Neutro Abs 3.5  1.7 - 7.7 K/uL   Lymphocytes Relative 32  12 - 46 %   Lymphs Abs 1.9  0.7 - 4.0 K/uL   Monocytes Relative 9  3 - 12 %   Monocytes Absolute 0.5  0.1 - 1.0 K/uL   Eosinophils Relative 2  0 - 5 %   Eosinophils Absolute 0.1  0.0 - 0.7 K/uL   Basophils Relative 0  0 - 1 %   Basophils Absolute 0.0  0.0 - 0.1 K/uL    ED Course: Dr. Read Drivers in to examine the patient and discuss lab findings and plan of care.  Procedures  MDM  57 y.o. female with flu like symptoms. She will treat her symptoms and follow up with her PCP. She will return for any problems.  Discussed with the patient and all questioned fully answered. Vital signs stable. BP 172/96  Pulse 87  Temp(Src) 98.4 F (36.9 C) (Oral)  Resp 16  Ht 5\' 7"  (1.702 m)  Wt 185 lb (83.915 kg)  BMI 28.97 kg/m2  SpO2 100%  LMP 07/28/2013    Janne Napoleon, NP 08/17/13 1639

## 2013-08-17 NOTE — ED Provider Notes (Signed)
Medical screening examination/treatment/procedure(s) were performed by non-physician practitioner and as supervising physician I was immediately available for consultation/collaboration.  EKG Interpretation   None         Hanley Seamen, MD 08/17/13 2243

## 2013-09-02 HISTORY — PX: NASAL TURBINATE REDUCTION: SHX2072

## 2015-03-29 ENCOUNTER — Other Ambulatory Visit: Payer: Self-pay | Admitting: Family Medicine

## 2015-03-29 DIAGNOSIS — Z1231 Encounter for screening mammogram for malignant neoplasm of breast: Secondary | ICD-10-CM

## 2015-04-16 ENCOUNTER — Encounter (HOSPITAL_BASED_OUTPATIENT_CLINIC_OR_DEPARTMENT_OTHER): Payer: Self-pay

## 2015-04-16 ENCOUNTER — Emergency Department (HOSPITAL_BASED_OUTPATIENT_CLINIC_OR_DEPARTMENT_OTHER)
Admission: EM | Admit: 2015-04-16 | Discharge: 2015-04-16 | Disposition: A | Discharge status: Home / Self Care | Attending: Emergency Medicine | Admitting: Emergency Medicine

## 2015-04-16 ENCOUNTER — Emergency Department (HOSPITAL_BASED_OUTPATIENT_CLINIC_OR_DEPARTMENT_OTHER)

## 2015-04-16 DIAGNOSIS — E079 Disorder of thyroid, unspecified: Secondary | ICD-10-CM | POA: Insufficient documentation

## 2015-04-16 DIAGNOSIS — Z3202 Encounter for pregnancy test, result negative: Secondary | ICD-10-CM | POA: Diagnosis not present

## 2015-04-16 DIAGNOSIS — Z79899 Other long term (current) drug therapy: Secondary | ICD-10-CM | POA: Diagnosis not present

## 2015-04-16 DIAGNOSIS — Z8669 Personal history of other diseases of the nervous system and sense organs: Secondary | ICD-10-CM | POA: Insufficient documentation

## 2015-04-16 DIAGNOSIS — R1032 Left lower quadrant pain: Secondary | ICD-10-CM | POA: Diagnosis present

## 2015-04-16 DIAGNOSIS — Z880 Allergy status to penicillin: Secondary | ICD-10-CM | POA: Diagnosis not present

## 2015-04-16 DIAGNOSIS — K5732 Diverticulitis of large intestine without perforation or abscess without bleeding: Secondary | ICD-10-CM

## 2015-04-16 DIAGNOSIS — Z791 Long term (current) use of non-steroidal anti-inflammatories (NSAID): Secondary | ICD-10-CM | POA: Insufficient documentation

## 2015-04-16 LAB — COMPREHENSIVE METABOLIC PANEL
ALT: 24 U/L (ref 14–54)
AST: 25 U/L (ref 15–41)
Albumin: 4.2 g/dL (ref 3.5–5.0)
Alkaline Phosphatase: 42 U/L (ref 38–126)
Anion gap: 10 (ref 5–15)
BUN: 13 mg/dL (ref 6–20)
CO2: 24 mmol/L (ref 22–32)
CREATININE: 0.84 mg/dL (ref 0.44–1.00)
Calcium: 9.3 mg/dL (ref 8.9–10.3)
Chloride: 106 mmol/L (ref 101–111)
GFR calc Af Amer: >60 mL/min (ref >60)
GLUCOSE: 120 mg/dL — AB (ref 65–99)
Potassium: 3.7 mmol/L (ref 3.5–5.1)
SODIUM: 140 mmol/L (ref 135–145)
TOTAL PROTEIN: 7.1 g/dL (ref 6.5–8.1)
Total Bilirubin: 1.1 mg/dL (ref 0.3–1.2)

## 2015-04-16 LAB — URINALYSIS, ROUTINE W REFLEX MICROSCOPIC
BILIRUBIN URINE: NEGATIVE
Glucose, UA: NEGATIVE mg/dL
Ketones, ur: NEGATIVE mg/dL
Nitrite: NEGATIVE
Protein, ur: NEGATIVE mg/dL
Specific Gravity, Urine: 1.019 (ref 1.005–1.030)
UROBILINOGEN UA: 0.2 mg/dL (ref 0.0–1.0)
pH: 6 (ref 5.0–8.0)

## 2015-04-16 LAB — URINE MICROSCOPIC-ADD ON

## 2015-04-16 LAB — CBC WITH DIFFERENTIAL/PLATELET
BASOS PCT: 0 % (ref 0–1)
Basophils Absolute: 0 10*3/uL (ref 0.0–0.1)
Eosinophils Absolute: 0.1 10*3/uL (ref 0.0–0.7)
Eosinophils Relative: 1 % (ref 0–5)
HEMATOCRIT: 41.8 % (ref 36.0–46.0)
HEMOGLOBIN: 14.1 g/dL (ref 12.0–15.0)
LYMPHS PCT: 13 % (ref 12–46)
Lymphs Abs: 1.2 10*3/uL (ref 0.7–4.0)
MCH: 29.8 pg (ref 26.0–34.0)
MCHC: 33.7 g/dL (ref 30.0–36.0)
MCV: 88.4 fL (ref 78.0–100.0)
MONOS PCT: 10 % (ref 3–12)
Monocytes Absolute: 0.9 10*3/uL (ref 0.1–1.0)
NEUTROS PCT: 76 % (ref 43–77)
Neutro Abs: 7.3 10*3/uL (ref 1.7–7.7)
Platelets: 269 10*3/uL (ref 150–400)
RBC: 4.73 MIL/uL (ref 3.87–5.11)
RDW: 13.2 % (ref 11.5–15.5)
WBC: 9.5 10*3/uL (ref 4.0–10.5)

## 2015-04-16 LAB — PREGNANCY, URINE: PREG TEST UR: NEGATIVE

## 2015-04-16 MED ORDER — ONDANSETRON 4 MG PO TBDP: ORAL_TABLET | ORAL | Status: AC

## 2015-04-16 MED ORDER — IOHEXOL 300 MG/ML  SOLN
100.0000 mL | Freq: Once | INTRAMUSCULAR | Status: AC | PRN
  Administered 2015-04-16: 100 mL via INTRAVENOUS

## 2015-04-16 MED ORDER — CIPROFLOXACIN HCL 500 MG PO TABS: 500.0000 mg | ORAL_TABLET | Freq: Two times a day (BID) | ORAL | Status: AC

## 2015-04-16 MED ORDER — OXYCODONE-ACETAMINOPHEN 5-325 MG PO TABS: 1.0000 | ORAL_TABLET | Freq: Four times a day (QID) | ORAL | Status: AC | PRN

## 2015-04-16 MED ORDER — METRONIDAZOLE 500 MG PO TABS: 500.0000 mg | ORAL_TABLET | Freq: Two times a day (BID) | ORAL | Status: AC

## 2015-04-16 MED ORDER — ONDANSETRON HCL 4 MG/2ML IJ SOLN
4.0000 mg | Freq: Once | INTRAMUSCULAR | Status: AC
  Administered 2015-04-16: 4 mg via INTRAVENOUS
  Filled 2015-04-16: qty 2

## 2015-04-16 MED ORDER — IOHEXOL 300 MG/ML  SOLN
50.0000 mL | Freq: Once | INTRAMUSCULAR | Status: AC | PRN
  Administered 2015-04-16: 50 mL via ORAL

## 2015-04-16 MED ORDER — HYDROMORPHONE HCL 1 MG/ML IJ SOLN
1.0000 mg | Freq: Once | INTRAMUSCULAR | Status: AC
  Administered 2015-04-16: 1 mg via INTRAVENOUS
  Filled 2015-04-16: qty 1

## 2015-04-16 NOTE — ED Notes (Signed)
C/o pain and nausea, meds given, attempting PO contrast. Alert, NAD, calm, interactive, family at [facility], VSS.

## 2015-04-16 NOTE — ED Notes (Signed)
Up to b/r, dizzy, taken by w/c, family at side.

## 2015-04-16 NOTE — ED Notes (Signed)
Pt reports lower abdominal pain and thinks she is having a flare of diverticulitis. Denies dysuria.

## 2015-04-16 NOTE — Discharge Instructions (Signed)
Take percocet for severe pain only. No driving or operating heavy machinery while taking percocet. This medication may cause drowsiness. Take Zofran as directed as needed for nausea. Take both antibiotics as directed for the entire two weeks.  Diverticulitis Diverticulitis is inflammation or infection of small pouches in your colon that form when you have a condition called diverticulosis. The pouches in your colon are called diverticula. Your colon, or large intestine, is where water is absorbed and stool is formed. Complications of diverticulitis can include:  Bleeding.  Severe infection.  Severe pain.  Perforation of your colon.  Obstruction of your colon. CAUSES  Diverticulitis is caused by bacteria. Diverticulitis happens when stool becomes trapped in diverticula. This allows bacteria to grow in the diverticula, which can lead to inflammation and infection. RISK FACTORS People with diverticulosis are at risk for diverticulitis. Eating a diet that does not include enough fiber from fruits and vegetables may make diverticulitis more likely to develop. SYMPTOMS  Symptoms of diverticulitis may include:  Abdominal pain and tenderness. The pain is normally located on the left side of the abdomen, but may occur in other areas.  Fever and chills.  Bloating.  Cramping.  Nausea.  Vomiting.  Constipation.  Diarrhea.  Blood in your stool. DIAGNOSIS  Your health care provider will ask you about your medical history and do a physical exam. You may need to have tests done because many medical conditions can cause the same symptoms as diverticulitis. Tests may include:  Blood tests.  Urine tests.  Imaging tests of the abdomen, including X-rays and CT scans. When your condition is under control, your health care provider may recommend that you have a colonoscopy. A colonoscopy can show how severe your diverticula are and whether something else is causing your  symptoms. TREATMENT  Most cases of diverticulitis are mild and can be treated at home. Treatment may include:  Taking over-the-counter pain medicines.  Following a clear liquid diet.  Taking antibiotic medicines by mouth for 7-10 days. More severe cases may be treated at a hospital. Treatment may include:  Not eating or drinking.  Taking prescription pain medicine.  Receiving antibiotic medicines through an IV tube.  Receiving fluids and nutrition through an IV tube.  Surgery. HOME CARE INSTRUCTIONS   Follow your health care provider's instructions carefully.  Follow a full liquid diet or other diet as directed by your health care provider. After your symptoms improve, your health care provider may tell you to change your diet. He or she may recommend you eat a high-fiber diet. Fruits and vegetables are good sources of fiber. Fiber makes it easier to pass stool.  Take fiber supplements or probiotics as directed by your health care provider.  Only take medicines as directed by your health care provider.  Keep all your follow-up appointments. SEEK MEDICAL CARE IF:   Your pain does not improve.  You have a hard time eating food.  Your bowel movements do not return to normal. SEEK IMMEDIATE MEDICAL CARE IF:   Your pain becomes worse.  Your symptoms do not get better.  Your symptoms suddenly get worse.  You have a fever.  You have repeated vomiting.  You have bloody or black, tarry stools. MAKE SURE YOU:   Understand these instructions.  Will watch your condition.  Will get help right away if you are not doing well or get worse. Document Released: 05/29/2005 Document Revised: 08/24/2013 Document Reviewed: 07/14/2013 [facility] Patient Information 2015 [place], [place]. This information is not  intended to replace advice given to you by your health care provider. Make sure you discuss any questions you have with your health care provider. ° °

## 2015-04-16 NOTE — ED Notes (Signed)
Delay in vital sign reassessment due to patient being over in imaging.

## 2015-04-16 NOTE — ED Notes (Signed)
EDPA at [facility]. Pt alert, NAD,calm, itneractive, family at [facility].

## 2015-04-16 NOTE — ED Provider Notes (Signed)
CSN: [id number]     Arrival date & time 04/16/15  1944 History   First MD Initiated Contact with Patient 04/16/15 1951     Chief Complaint  Patient presents with  . Abdominal Pain     (Consider location/radiation/quality/duration/timing/severity/associated sxs/prior Treatment) HPI Comments: 59 year old female presenting with left lower quadrant abdominal pain 3 weeks, gradually worsening becoming severe over the past few days. Pain is a constant cramping, very sore, worse with laying flat in certain position changes. Believes she had a few flares of diverticulitis over the past 3 weeks that have self limited, however is now returning much more intense. Admits to associated nausea without vomiting. Has had a few episodes of nonbloody diarrhea. This feels similar to her prior diverticulitis flares. About 6 months ago had a colonoscopy in [place] where she was living at the time and was told she had diverticulosis throughout her entire colon which was more extensive than previously when she only had it in one part of her colon. Does not have a GI here in [place] at this time as she just moved back.  Patient is a 59 y.o. female presenting with abdominal pain. The history is provided by the patient.  Abdominal Pain Pain location:  LLQ Pain quality: cramping   Pain radiation: lower abdomen. Pain severity:  Severe Onset quality:  Gradual Duration: 3 weeks, worsening over the past few days. Timing:  Constant Progression:  Worsening Chronicity:  Recurrent Relieved by:  Nothing Worsened by:  Movement and position changes Associated symptoms: chills, diarrhea, fever and nausea     Past Medical History  Diagnosis Date  . Diverticulitis   . Sleep apnea   . Thyroid disease    Past Surgical History  Procedure Laterality Date  . Tonsillectomy     History reviewed. No pertinent family history. Social History  Substance Use Topics  . Smoking status: Never Smoker   . Smokeless tobacco: None   . Alcohol Use: Yes   OB History    No data available     Review of Systems  Constitutional: Positive for fever, chills and appetite change.  Gastrointestinal: Positive for nausea, abdominal pain and diarrhea.  All other systems reviewed and are negative.     Allergies  Amoxil; Cephalexin; Erythromycin; Nitroglycerin; Penicillins; Septra; and Sulfa antibiotics  Home Medications   Prior to Admission medications   Medication Sig Start Date End Date Taking? Authorizing Provider  levothyroxine (SYNTHROID, LEVOTHROID) 100 MCG tablet Take 100 mcg by mouth daily.     Yes Historical Provider, MD  ciprofloxacin (CIPRO) 500 MG tablet Take 1 tablet (500 mg total) by mouth 2 (two) times daily. One po bid x 14 days 04/16/15   Nada Boozer Adilenne Ashworth, PA-C  clindamycin (CLEOCIN T) 1 % lotion Apply topically 2 (two) times daily.    Historical Provider, MD  metroNIDAZOLE (FLAGYL) 500 MG tablet Take 1 tablet (500 mg total) by mouth 2 (two) times daily. One po bid x 14 days 04/16/15   Kathrynn Speed, PA-C  ondansetron (ZOFRAN ODT) 4 MG disintegrating tablet 4mg  ODT q4 hours prn nausea/vomit 04/16/15   Morrison Mcbryar M Milany Geck, PA-C  oxyCODONE-acetaminophen (PERCOCET) 5-325 MG per tablet Take 1-2 tablets by mouth every 6 (six) hours as needed for severe pain. 04/16/15   Bryauna Byrum M Karista Aispuro, PA-C   BP 127/82 mmHg  Pulse 98  Temp(Src) 99.7 F (37.6 C) (Oral)  Resp 18  Ht 5\' 6"  (1.676 m)  Wt 180 lb (81.647 kg)  BMI 29.07 kg/m2  SpO2 97%  LMP 03/02/2015 Physical Exam  Constitutional: She is oriented to person, place, and time. She appears well-developed and well-nourished. No distress.  HENT:  Head: Normocephalic and atraumatic.  Mouth/Throat: Oropharynx is clear and moist.  Eyes: Conjunctivae and EOM are normal.  Neck: Normal range of motion. Neck supple.  Cardiovascular: Normal rate, regular rhythm and normal heart sounds.   Pulmonary/Chest: Effort normal and breath sounds normal. No respiratory distress.  Abdominal: Normal  appearance. She exhibits no distension. There is tenderness in the left lower quadrant. There is guarding. There is no rigidity and no rebound.  Tenderness to LLQ with palpation of other quadrants.  Musculoskeletal: Normal range of motion. She exhibits no edema.  Neurological: She is alert and oriented to person, place, and time. No sensory deficit.  Skin: Skin is warm and dry.  Psychiatric: She has a normal mood and affect. Her behavior is normal.  Nursing note and vitals reviewed.   ED Course  Procedures (including critical care time) Labs Review Labs Reviewed  URINALYSIS, ROUTINE W REFLEX MICROSCOPIC (NOT AT [facility]) - Abnormal; Notable for the following:    Hgb urine dipstick TRACE (*)    Leukocytes, UA SMALL (*)    All other components within normal limits  COMPREHENSIVE METABOLIC PANEL - Abnormal; Notable for the following:    Glucose, Bld 120 (*)    All other components within normal limits  URINE MICROSCOPIC-ADD ON - Abnormal; Notable for the following:    Bacteria, UA MANY (*)    All other components within normal limits  PREGNANCY, URINE  CBC WITH DIFFERENTIAL/PLATELET    Imaging Review Ct Abdomen Pelvis W Contrast  04/16/2015   CLINICAL DATA:  Left lower quadrant pain.  History of diverticulosis  EXAM: CT ABDOMEN AND PELVIS WITH CONTRAST  TECHNIQUE: Multidetector CT imaging of the abdomen and pelvis was performed using the standard protocol following bolus administration of intravenous contrast.  CONTRAST:  50mL OMNIPAQUE IOHEXOL 300 MG/ML SOLN, OMNIPAQUE IOHEXOL 300 MG/ML SOLN  COMPARISON:  09/16/2011  FINDINGS: BODY WALL: Fatty periumbilical hernia.  LOWER CHEST: No contributory findings.  ABDOMEN/PELVIS:  Liver: Hepatic steatosis without focal lesion.  Biliary: No evidence of biliary obstruction or stone.  Pancreas: Unremarkable.  Spleen: Unremarkable.  Adrenals: Unremarkable.  Kidneys and ureters: No hydronephrosis or stone. 5 mm low-density in the interpolar right  kidney is too small to characterize.  Bladder: Unremarkable.  Reproductive: No pathologic findings.  Bowel: Sigmoid wall thickening surrounding a thickened and indistinct diverticulum . No free perforation or abscess. Colonic diverticulosis is extensive for age. No bowel obstruction.  Retroperitoneum: No mass or adenopathy.  Peritoneum: Trace reactive ascites.  No pneumoperitoneum.  Vascular: No acute abnormality.  OSSEOUS: Lower lumbar degenerative disc and facet disease.  IMPRESSION: Uncomplicated sigmoid diverticulitis.   Electronically Signed   By: Marnee Spring M.D.   On: 04/16/2015 22:21   I, Celene Skeen, personally reviewed and evaluated these images and lab results as part of my medical decision-making.   EKG Interpretation None      MDM   Final diagnoses:  Sigmoid diverticulitis   Nontoxic appearing, NAD. Afebrile. Tachycardic on arrival. Vitals otherwise stable. Abdomen is soft. She has left lower quadrant tenderness with guarding and pain to left lower quadrant with palpation in other quadrants. History of diverticulitis, reporting diarrhea. CT obtained to rule out abscess or perforation. CT with results as stated above. Labs of an acute finding. UA equivocal, asymptomatic, culture pending. Given Dilaudid in the ED with  some relief of her pain. Abdomen still tender but improved from initial exam. Patient concerned about taking antibiotics as she frequently gets diarrhea and result of taking antibiotics. Given the patient's presenting symptoms and CT findings, I discussed with her that management of antibiotics is appropriate. Will treat with Cipro and Flagyl. Percocet for pain. Follow-up with PCP and GI. Stable for discharge. Return precautions given. Patient states understanding of treatment care plan and is agreeable.  Kathrynn Speed, PA-C 04/16/15 2303  Nelva Nay, MD 04/16/15 [phone]

## 2015-04-18 ENCOUNTER — Encounter: Payer: Self-pay | Admitting: Gastroenterology

## 2015-04-18 LAB — URINE CULTURE

## 2015-05-01 ENCOUNTER — Other Ambulatory Visit: Payer: Self-pay | Admitting: Nurse Practitioner

## 2015-05-01 DIAGNOSIS — N6022 Fibroadenosis of left breast: Secondary | ICD-10-CM

## 2015-05-01 DIAGNOSIS — R928 Other abnormal and inconclusive findings on diagnostic imaging of breast: Secondary | ICD-10-CM

## 2015-06-13 ENCOUNTER — Ambulatory Visit: Admitting: Gastroenterology

## 2019-09-03 HISTORY — PX: BREAST SURGERY: SHX581

## 2022-06-14 ENCOUNTER — Ambulatory Visit (INDEPENDENT_AMBULATORY_CARE_PROVIDER_SITE_OTHER): Payer: Medicare Other | Admitting: Student in an Organized Health Care Education/Training Program

## 2022-06-14 ENCOUNTER — Encounter (INDEPENDENT_AMBULATORY_CARE_PROVIDER_SITE_OTHER): Payer: Self-pay

## 2022-09-24 ENCOUNTER — Encounter (INDEPENDENT_AMBULATORY_CARE_PROVIDER_SITE_OTHER): Payer: Self-pay | Admitting: Student in an Organized Health Care Education/Training Program

## 2022-09-26 ENCOUNTER — Encounter (INDEPENDENT_AMBULATORY_CARE_PROVIDER_SITE_OTHER): Payer: Self-pay | Admitting: Student in an Organized Health Care Education/Training Program

## 2022-09-26 ENCOUNTER — Other Ambulatory Visit: Payer: Self-pay

## 2022-09-26 ENCOUNTER — Ambulatory Visit
Payer: Medicare Other | Attending: Student in an Organized Health Care Education/Training Program | Admitting: Student in an Organized Health Care Education/Training Program

## 2022-09-26 VITALS — BP 128/88 | HR 71 | Temp 98.3°F | Ht 66.0 in | Wt 193.8 lb

## 2022-09-26 DIAGNOSIS — R768 Other specified abnormal immunological findings in serum: Secondary | ICD-10-CM | POA: Insufficient documentation

## 2022-09-26 DIAGNOSIS — T783XXA Angioneurotic edema, initial encounter: Secondary | ICD-10-CM | POA: Insufficient documentation

## 2022-09-26 NOTE — Progress Notes (Signed)
[facility]  [place] [id number]    HPI:  This is a case of a 67 y.o. year old female who comes in today as a referral for positive antinuclear antibody.  No labs sent with referral.    Medical conditions notable for:  - obstructive sleep apnea   - osteoarthritis   - hypothyroidism  - mast cell activation syndrome    Patient sees hematology in [place] and they had ordered an ANA which came back as positive. 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 No other rashes outside of her normal MCAS rash. 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 She had provoked LLE DVT. She doesn't recall if she has on blood thinners. Occasional blisters in her throat which she thinks it is related to a food. No hair loss.     Denies having any shortness of breath, chest pain, abdominal pain, fevers, unintential weight loss.    All systems reviewed and are otherwise unremarkable unless stated above.    Social Hx- no alcohol use, no smoking, resides in [place], [place]; 2 miscarriages (3 months)   Family Hx- dad- RA    Current Outpatient Medications   Medication Sig Dispense Refill    EPINEPHrine (EPIPEN) 0.3 mg/0.3 mL Injection Auto-Injector Inject 0.3 mL (0.3 mg total) into the muscle Once, as needed for ANAPHYLAXIS      famotidine (PEPCID) 20 mg Oral Tablet Take 1 Tablet (20 mg total) by mouth Twice daily      fexofenadine (ALLEGRA) 60 mg Oral Tablet Take 1 Tablet (60 mg total) by mouth Once a day      levothyroxine (SYNTHROID) 50 mcg Oral Tablet Take 1 Tablet (50 mcg total) by mouth Every morning       No current facility-administered medications for this visit.     Allergies   Allergen Reactions    Advil [Ibuprofen]     Amoxicillin     Benadryl [Diphenhydramine Hcl]      GIVES OPPOSITE REACTION    Benzocaine-Sulfur     Ciprofloxacin     Clindamycin     Erythromycin     Levaquin [Levofloxacin]     Penicillins           Past Surgical History:   Procedure Laterality Date    BREAST SURGERY  2021    TO REMOVE ABCESS/CELLULITIS    HX BREAST BIOPSY      3 BREAST BIOPSIES-FIBRO EDENOMA    HX DILATION AND CURETTAGE  1982    MISCARRIAGE    HX TONSILLECTOMY  1960    TROUBLE BREATHING    NASAL TURBINATE REDUCTION  2015    BY ABLATION- TROUBLE BREATHING     PHYSICAL EXAM:  VITALS: BP 128/88   Pulse 71   Temp 36.8 C (98.3 F) (Thermal Scan)   Ht 1.676 m ('5\' 6"'$ )   Wt 87.9 kg (193 lb 12.6 oz)   SpO2 98%   BMI 31.28 kg/m     General- well appearing woman resting comfortably in chair  Neurologic- no focal deficits noted, speaks in full sentences and answers questions appropriately   HEENT- non traumatic skull, no scleral icterus, normal appearing oral mucosa,   Lungs- normal work of breathing, CTA bilaterally, no wheezes, no rhonchi  Heart- regular rate, regular rhythm, normal S1 and S2, no murmur  Abdomen- soft, non tender, non distended,   Skin- no  no rashes noted on exposed skin, normal appearing finger nails bilaterally   Musculoskeletal- no synovitis in the small joints of the hands and wrists, 5/5 muscle strength in upper and lower extremities  Psych- appropriate mood and corresponding affect     Labs/imaging:  No labs or imaging available for review  -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 year old female presents today for evaluation of positive antinuclear antibody and a positive RNP by Elisa.  Patient does not have clinical symptoms suspicious for an autoimmune connective tissue disease such as systemic lupus erythematous, systemic sclerosis, or Sjogren's disease.  I suspect the RNP antibody to be a false-positive.  I do anticipate she has a positive ANA but I do  not suspected to be pathologic.  Low positive titers of ANA commonly seen in Hashimoto's. Patient counseled that I anticipate ANA to be at a titer of 1:320 or less.  If ANA significantly elevated than will arrange follow-up visit and additional labs.    Patient has history of mast cell activation syndrome as well as having a fluctuating value of her C1 esterase inhibitor.  Most recent value 16.  Patient also endorses having angioedema.  I am not extremely familiar with mast cell activation syndrome but I suspect but it can cause angioedema by itself.  Patient has history of angioedema and therefore I feel she would benefit from seeing Allergy immunology.  Patient amenable and referral was placed.  Patient counseled to use her EpiPen if needed.    1. Positive ANA (antinuclear antibody)  -plan as outlined above  - HEP-2 SUBSTRATE ANTINUCLEAR ANTIBODIES (ANA), SERUM    2. Angioedema  - Refer to [facility] Future      Return if symptoms worsen or fail to improve.    Jerelyn Charles, MD

## 2022-10-01 LAB — ANA REFLEX
ANA FINAL INTERPRETATION: POSITIVE — AB
ANA TITER: 1:160 {titer}

## 2022-10-08 ENCOUNTER — Ambulatory Visit (HOSPITAL_BASED_OUTPATIENT_CLINIC_OR_DEPARTMENT_OTHER): Payer: Self-pay | Admitting: Pediatric Allergy/Immunology

## 2022-10-08 ENCOUNTER — Other Ambulatory Visit: Payer: Self-pay

## 2022-10-08 ENCOUNTER — Encounter (HOSPITAL_BASED_OUTPATIENT_CLINIC_OR_DEPARTMENT_OTHER): Payer: Self-pay

## 2022-10-08 ENCOUNTER — Ambulatory Visit: Payer: Medicare Other | Attending: Pediatric Allergy/Immunology | Admitting: Pediatric Allergy/Immunology

## 2022-10-08 ENCOUNTER — Encounter (HOSPITAL_BASED_OUTPATIENT_CLINIC_OR_DEPARTMENT_OTHER): Payer: Self-pay | Admitting: Pediatric Allergy/Immunology

## 2022-10-08 DIAGNOSIS — D841 Defects in the complement system: Secondary | ICD-10-CM | POA: Insufficient documentation

## 2022-10-08 MED ORDER — ORLADEYO 150 MG CAPSULE
150.0000 mg | ORAL_CAPSULE | Freq: Every day | ORAL | 2 refills | Status: DC
Start: 2022-10-08 — End: 2022-10-11
  Filled 2022-10-08: qty 30, 30d supply, fill #0

## 2022-10-08 MED ORDER — ICATIBANT 30 MG/3 ML SUBCUTANEOUS SYRINGE
1.0000 | INJECTION | Freq: Four times a day (QID) | SUBCUTANEOUS | 2 refills | Status: DC | PRN
Start: 2022-10-08 — End: 2023-03-24
  Filled 2022-10-08: qty 9, 1d supply, fill #0
  Filled 2023-02-05: qty 9, 1d supply, fill #1
  Filled 2023-03-10: qty 9, 1d supply, fill #2

## 2022-10-08 NOTE — Telephone Encounter (Signed)
Saddie Benders order and patient information faxed to [facility] [employer] successfully. Note included advising patient will call to provide verbal consent.   Marletta Lor, RN

## 2022-10-08 NOTE — Patient Instructions (Addendum)
Hereditary Angioedema Type I  Erythema marginatum is a sign of HAE, often mistaken as hives or urticaria, lyme rash, or ring worm    [email or web address]    Start:  Orladeyo '150mg'$  daily (take with food) - call if unable to tolerate upset stomach   Icatibant SC every 6 hours as needed for angioedema.  Do not exceed 3 doses in a 24 hour period    Epinephrine, Steroids, and antihistamines do not help HAE swelling (bradykinin Mediated).  But if your air is about to close Epinephrine IM into anterolateral thigh can open the air way long enough for intubation it that is needed.  FFP (Fresh Frozen Plasma) can be given in the Emergency Department, but small risk of making things worse, it amount is normally 4 unites of FFP    Epi, Steroids and antihistamines can help itchy skin, hives, and swelling caused by mast cells (histamine mediated)    If needing surgery, even dental procedures you will need prophylaxis.  Call to coordinate care.     Child can have: C1 esterase level, C1 esterase function, and C4 level checked by Primary care doctor prior to referral to Allergist and Immunologist.      Follow-up in 3 months or sooner if needed

## 2022-10-08 NOTE — Progress Notes (Addendum)
ALLERGY/IMMUNOLOGY, [facility]  [place] [address]  New Office Visit    Name: Catherine Pruitt   DOB: 10-09-1955  Date of Service: 10/08/2022     Chief Complaint(s):   Chief Complaint   Patient presents with    Lip Swelling       HPI    Catherine Pruitt is a 67 y.o. 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    She developed angioedema, tongue swelling and hives starting 26 years ago. No one knew what was causing her symptoms inially. She had an EGD by GI who saw inflammation of her esophagus. She also had problems with diarrhea. Her gastroenterologist started H1 and H2 blockers which improved her symptoms. She was diagnosed with MCAS and has been following with hematology in [place] for it. She has been in the hospital multiple times for anaphylaxis and mast cell reactions. She has never been intubated for a reaction. She has never had tryptase drawn immediately when symptoms occur. Her BP gets high and HR gets low when she has reactions. She was in the hospital 1.5 years ago for MCAS and had eaten corn beforehand. She has not had any flares since then. Her current medications are epinephrine pen PRN, allegra and Pepcid. She notes similar symptoms in her family, in nieces and nephews. She states she has issues also with idiopathic swelling, and today her legs were more swollen. She has had laryngeal swelling and shortness of breath in the past. She notes red rash in the past as well.    She had allergy testing done in May 2023 that showed allergies to dog, sweet vernal, grasses, rag weed and lamb quarters. Food allergy testing was positive for wheat. She has eaten gluten free for many years. She avoids high histamine foods.     Hematology has been following her labs, which she brings in today:  C1 esterase inhibitor levels:  07-31-21 16 (low)  12-12-21  32  01-15-22 16   03-15-22 20 (low)    C1 esterase inhibitor function  09-24-21 >93  12-12-21 89    Tryptase  12-12-21 4.9  03-15-22 5.6    C3  12-12-21 129    IgE  12-12-21 414    Anti-C1Q Ab, IgG  03-15-22 <20    Her hematologist recently found that she had a positive ANA. She was referred to this clinic by rheumatology. It was determined that her ANA was a false positive as she had no clinical symptoms of autoimmune disease.     Review of Systems (bold are positive)  Constitutional: fevers, chills, night sweats, fatigue, weight loss/gain  Eyes: discharge, pruritis, erythema  ENT: congestion, rhinorrhea, headaches, sore throat, hearing/vision changes  Heart: chest pain, palpitations, presyncope/syncope  Lungs: shortness of breath, cough, wheezing  Abdomen:  nausea, vomiting, abdominal pain, melena, hematochezia, diarrhea, constipation  GU: dysuria, urinary frequency  Skin: rashes, jaundice, lesions  MSK: joint swelling, joint redness, arthralgias, myalgias, deformities  Heme/lymph: bleeding, lymphadenopathy, petechiae, ecchymosis, swelling  Neuro: numbness, tingling, focal weakness  Psych: depression, anxiety, hallucinations    Past Medical History  OSA  Angioedema  MCAS    Family History  Family History   Problem Relation Age of Onset    Drug Allergy Mother     Food Allergy Mother     Hypertension (High Blood Pressure) Mother     Heart Disease  Mother     Conjunctivitis Mother     Allergy Mother     Swallowing difficulties Mother     Sinusitis Mother     Heart Disease Father     Hypertension (High Blood Pressure) Father     Psoriasis Father     Arthritis-rheumatoid Father     Food Allergy Sister     Allergic rhinitis Sister     Thyroid Disease Sister     Food Allergy Brother     Allergic rhinitis Brother     COLITIS Brother        Medications  Current Outpatient Medications   Medication Sig    berotralstat (ORLADEYO) 150 mg Oral Capsule Take 1 Capsule (150 mg total) by mouth Once a day for 90 days    EPINEPHrine (EPIPEN)  0.3 mg/0.3 mL Injection Auto-Injector Inject 0.3 mL (0.3 mg total) into the muscle Once, as needed for ANAPHYLAXIS    famotidine (PEPCID) 20 mg Oral Tablet Take 1 Tablet (20 mg total) by mouth Twice daily    fexofenadine (ALLEGRA) 60 mg Oral Tablet Take 1 Tablet (60 mg total) by mouth Once a day    icatibant 30 mg/3 mL Subcutaneous Syringe Inject 3 mL (30 mg total) under the skin Every 6 hours as needed (angioedema flare) do not exceed 3 doses in a 24 hour period    levothyroxine (SYNTHROID) 50 mcg Oral Tablet Take 1 Tablet (50 mcg total) by mouth Every morning        Allergies  Allergies   Allergen Reactions    Keflet [Cephalexin] Rash    Ace Inhibitors      Has HAE type I.  Increased risk of Angioedema if on ACE inhibitors.  Has never had angioedema or allergic reaction to any ACE-inhibitor     Advil [Ibuprofen]     Amoxicillin     Benadryl [Diphenhydramine Hcl]      GIVES OPPOSITE REACTION    Benzocaine-Sulfur     Ciprofloxacin     Clindamycin     Erythromycin     Levaquin [Levofloxacin]     Penicillins     Sulfa (Sulfonamides) Stevens-Johnson Syndrome        Social History  Social History     Tobacco Use    Smoking status: Former     Years: 40     Types: Cigarettes    Smokeless tobacco: Never   Substance Use Topics    Alcohol use: Yes     Comment: OCCASIONALLY    Drug use: Never      OBJECTIVE  BP 122/70   Pulse 67   Temp 36.6 C (97.9 F)   Ht 1.676 m ('5\' 6"'$ )   Wt 88 kg (194 lb 0.1 oz)   BMI 31.31 kg/m     Physical Exam  Constitutional: Patient is well appearing appearing, in no acute distress, resting comfortably.  HEENT: Normocephalic and atraumatic, no rhinorrhea, turbinates without edema or erythema, pupils equal, conjunctiva clear, no icterus, moist mucus membranes, posterior oropharynx without erythema or post nasal drip  Neck: Trachea midline, neck supple  Heart: RRR, S1 and S2 audible, no murmurs, rubs, or gallops  Lungs: CTA bilaterally, no wheezing, crackles, or rhonchi  Abdomen: Bowel sounds  present, abdomen flat, non-distended  Skin: Warm and dry, no rashes/lesions visible on exposed skin  Extremities: No cyanosis, edema, or clubbing.  Neuro: Alert and answering questions appropriately, moving all four extremities.  Psych: Appropriate mood and affect.  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 Discussed physiology of this condition and associated symptoms  - Erythema marginatum is a sign of HAE, often mistaken as hives or urticaria, lyme rash, or ring worm  - Provided disease website for more information: Haea.org  - Patient educated on differential diagnosis including recommended workup as well as risks and benefits of treatment options  -Start: Orladeyo '150mg'$  daily (take with food) - call if unable to tolerate upset stomach   -Start Icatibant SC every 6 hours as needed for angioedema.  Do not exceed 3 doses in a 24 hour period  -Epinephrine, Steroids, and antihistamines do not help HAE swelling (bradykinin mediated). If airway is about to close, Epinephrine IM into anterolateral thigh can open the air way long enough for intubation if that is needed. FFP (Fresh Frozen Plasma) can be given in the Emergency Department, but small risk of making things worse, it amount is normally 4 units of FFP  -Epinephrine, steroids and antihistamines can help itchy skin, hives, and swelling caused by mast cells (histamine mediated)  -If needing surgery, even dental procedures you will need prophylaxis. Call to coordinate care.   -Child can have: C1 esterase level, C1 esterase function, and C4 level checked by PCP prior to referral to Allergist and Immunologist. Patient notes having 5 children.  -Ace inhibitor added to allergy  list    Patient understands and in agreement with plan    Follow up in 3 months, sooner if needed    [facility]  Internal Medicine PGY-3    I have seen and examined the patient on 10/08/2022.  I have reviewed the HPI, PE and A/P with the patient and the resident Dr. Jerilynn Mages. [place].  I agree with the above with the addition of the following:      Patient with hereditary angioedema type 1.  Patient's C1 esterase level has been low on more than 1 occasion.  Her C4 has been in range however this is a screener and C4 does not need to be low for somebody to have HAE.    Patient has pictures showing angioedema as well as the erythema marginatum.  She also has a history of poor response to antihistamines, steroids and even epinephrine.    Patient also reports once in a lifetime laryngeal swelling.  She also had flares during dental procedures and tooth extractions.  Patient recalls swelling even as a child.  Given the length of time the patient has had angioedema this makes the risk for acquired angioedema very low and her C1q is in range and her anti-C1q was not detected.     Patient's history is not consistent with mast cell activation syndrome.  Her tryptase level failed to increase by 25% during acute flare.    Patient does have environmental allergies.    Patient will be leaving the country on vacation within the next week given the length of time it takes to get emergency medications approved in the unlikely this that other countries will have the HAE rescue medication  The patient was given a sample of Icatibant SC to  take with her.  HAE is a life threatening condition that requires access to rescue medication.       Tempestt Silba P. Hector Brunswick, PhD  Associate Professor  Pediatric and Adult Allergy and Immunology     Jethro Poling, DO 10/08/2022, 13:02

## 2022-10-09 ENCOUNTER — Other Ambulatory Visit: Payer: Self-pay

## 2022-10-10 ENCOUNTER — Other Ambulatory Visit: Payer: Self-pay

## 2022-10-11 ENCOUNTER — Other Ambulatory Visit (HOSPITAL_BASED_OUTPATIENT_CLINIC_OR_DEPARTMENT_OTHER): Payer: Self-pay | Admitting: Pediatric Allergy/Immunology

## 2022-10-11 ENCOUNTER — Other Ambulatory Visit: Payer: Self-pay | Admitting: Pharmacist

## 2022-10-11 ENCOUNTER — Other Ambulatory Visit: Payer: Self-pay

## 2022-10-11 MED ORDER — ORLADEYO 150 MG CAPSULE
150.0000 mg | ORAL_CAPSULE | Freq: Every day | ORAL | 2 refills | Status: AC
Start: 2022-10-11 — End: 2023-01-09

## 2022-10-11 NOTE — Telephone Encounter (Signed)
Specialty Pharmacy Initial Assessment Note    Date of assessment: 10/11/2022  Patient: Catherine Pruitt is a 67 y.o. female  Diagnosis: HAE Type 1  Specialty Medication(s): Icatibant 30 mg Inject 1 syringe (30 mg total) under the skin Every 6 hours as needed (angioedema flare) do not exceed 3 doses in a 24 hour period  Prescriber: Jethro Poling, DO    Subjective:  Spoke with Catherine Pruitt regarding initial icatibant education. Denies missed days from work/school or planned activities due to disease. Denies recent hospitalization/ER/urgent care visit in past month. Patient reports a goal of control of flares with icatibant therapy. She also has a history of mast cell activation syndrome. She states her last flare was around a year and a half ago and that it's hard to tell the difference between the two. States her flares usually involve her lips, eyelids, face, stomach, fingers, and that she sometimes has shortness of breath. She lists her triggers as stress, corn, wheat, and high histamine foods.    Obtained updated medication list.    Objective:   Pertinent labs include:   12/12/21:   IgE: 414    Assessment:  Reviewed medications, conditions, and allergies with no known contraindications to therapy. No significant drug-drug interactions expected. Medication therapy is considered appropriate with a therapeutic goal of: medication efficacy and tolerability.     Plan:  Provided education regarding medication purpose, dosing, storage, administration, methods for adherence, and how to manage potential missed doses. Discussed potential side effects and monitoring including when to contact pharmacy, inform provider, and seek appropriate care. Medication to be delivered Wednesday 2/14 with [employer]. Patient plans to take medication as needed.    Scheduled pharmacist follow-up as needed. Patient had no additional questions and was encouraged to contact [facility] with any questions or concerns.     Catherine Pruitt, [facility]   10/11/2022, 11:03

## 2022-10-11 NOTE — Telephone Encounter (Signed)
Medication needs to go through [employer].    Minna Antis, LPN

## 2022-10-14 ENCOUNTER — Other Ambulatory Visit: Payer: Self-pay

## 2022-10-14 ENCOUNTER — Ambulatory Visit (HOSPITAL_BASED_OUTPATIENT_CLINIC_OR_DEPARTMENT_OTHER): Payer: Self-pay | Admitting: Pediatric Allergy/Immunology

## 2022-10-14 NOTE — Telephone Encounter (Addendum)
Called and spoke with Shirlee Limerick verified that Saddie Benders is the correct medication.    Minna Antis, LPN          Regarding: med question  ----- Message from Lujean Amel sent at 10/11/2022  3:35 PM EST -----  [facility]    Shirlee Limerick is calling needing clarification on the name for berotralstat (ORLADEYO) 150 mg Oral Capsule. Please call to discuss    Preferred Pharmacy        [place], [place]    [place] [address]    Phone: [phone] Fax: [phone]    Hours: Not open 24 hours

## 2022-10-22 ENCOUNTER — Other Ambulatory Visit: Payer: Self-pay

## 2022-10-23 ENCOUNTER — Other Ambulatory Visit: Payer: Self-pay

## 2022-11-21 ENCOUNTER — Encounter (HOSPITAL_BASED_OUTPATIENT_CLINIC_OR_DEPARTMENT_OTHER): Payer: Self-pay | Admitting: Pediatric Allergy/Immunology

## 2022-12-20 ENCOUNTER — Ambulatory Visit (HOSPITAL_BASED_OUTPATIENT_CLINIC_OR_DEPARTMENT_OTHER): Payer: Medicare Other

## 2022-12-27 ENCOUNTER — Other Ambulatory Visit: Payer: Self-pay

## 2022-12-31 ENCOUNTER — Other Ambulatory Visit: Payer: Self-pay

## 2023-01-06 ENCOUNTER — Other Ambulatory Visit: Payer: Self-pay

## 2023-01-06 ENCOUNTER — Ambulatory Visit: Payer: Medicare Other

## 2023-01-06 ENCOUNTER — Encounter (HOSPITAL_BASED_OUTPATIENT_CLINIC_OR_DEPARTMENT_OTHER): Payer: Self-pay

## 2023-01-06 ENCOUNTER — Telehealth (HOSPITAL_BASED_OUTPATIENT_CLINIC_OR_DEPARTMENT_OTHER): Payer: Self-pay | Admitting: Pediatric Allergy/Immunology

## 2023-01-06 ENCOUNTER — Other Ambulatory Visit (HOSPITAL_BASED_OUTPATIENT_CLINIC_OR_DEPARTMENT_OTHER): Payer: Self-pay | Admitting: Pediatric Allergy/Immunology

## 2023-01-06 ENCOUNTER — Ambulatory Visit (HOSPITAL_BASED_OUTPATIENT_CLINIC_OR_DEPARTMENT_OTHER): Payer: Self-pay | Admitting: Pediatric Allergy/Immunology

## 2023-01-06 VITALS — BP 143/85 | HR 95 | Ht 66.0 in | Wt 198.4 lb

## 2023-01-06 DIAGNOSIS — Z8719 Personal history of other diseases of the digestive system: Secondary | ICD-10-CM | POA: Insufficient documentation

## 2023-01-06 DIAGNOSIS — Z800 Family history of malignant neoplasm of digestive organs: Secondary | ICD-10-CM

## 2023-01-06 DIAGNOSIS — Z86010 Personal history of colonic polyps: Secondary | ICD-10-CM | POA: Insufficient documentation

## 2023-01-06 DIAGNOSIS — K449 Diaphragmatic hernia without obstruction or gangrene: Secondary | ICD-10-CM | POA: Insufficient documentation

## 2023-01-06 DIAGNOSIS — K5790 Diverticulosis of intestine, part unspecified, without perforation or abscess without bleeding: Secondary | ICD-10-CM | POA: Insufficient documentation

## 2023-01-06 DIAGNOSIS — D841 Defects in the complement system: Secondary | ICD-10-CM

## 2023-01-06 DIAGNOSIS — Z79899 Other long term (current) drug therapy: Secondary | ICD-10-CM

## 2023-01-06 DIAGNOSIS — R197 Diarrhea, unspecified: Secondary | ICD-10-CM | POA: Insufficient documentation

## 2023-01-06 DIAGNOSIS — K219 Gastro-esophageal reflux disease without esophagitis: Secondary | ICD-10-CM | POA: Insufficient documentation

## 2023-01-06 DIAGNOSIS — K900 Celiac disease: Secondary | ICD-10-CM | POA: Insufficient documentation

## 2023-01-06 DIAGNOSIS — G8929 Other chronic pain: Secondary | ICD-10-CM

## 2023-01-06 DIAGNOSIS — R1011 Right upper quadrant pain: Secondary | ICD-10-CM

## 2023-01-06 MED ORDER — PEG 3350-ELECTROLYTES 236 GRAM-22.74 GRAM-6.74 GRAM-5.86 GRAM SOLUTION
4.0000 L | Freq: Once | ORAL | 0 refills | Status: AC
Start: 2023-01-06 — End: 2023-01-06

## 2023-01-06 MED ORDER — PANTOPRAZOLE 20 MG TABLET,DELAYED RELEASE
20.0000 mg | DELAYED_RELEASE_TABLET | Freq: Every morning | ORAL | 1 refills | Status: DC
Start: 2023-01-06 — End: 2023-07-09

## 2023-01-06 MED ORDER — BISACODYL 5 MG TABLET
4.0000 | ORAL_TABLET | Freq: Once | ORAL | 0 refills | Status: AC
Start: 2023-01-06 — End: 2023-01-06

## 2023-01-06 NOTE — Progress Notes (Signed)
[facility]                                                      Department of Gastroenterology  [address]  [place], [place] [address]    Date:   01/06/2023  Name: Catherine Pruitt  Age: 67 y.o.    Referring Provider:    Tamsen Roers, DO  [address]  [place],  [place] [address]    Primary Care Provider:    Tamsen Roers, DO    Chief Complaint: New Patient and Rectal Bleeding    History of Present Illness  Catherine Pruitt is a pleasant 67 y.o. female with past medical history of diverticulosis, Hashimoto's thyroiditis, chronic sinusitis, Raynaud's syndrome, Levonne Spiller disease, thyroid disease.  She  is here for New Patient and Rectal Bleeding. The history is obtained from the patient.    Catherine Pruitt presents to clinic today complaining of recent history of rectal bleeding, uncontrolled gastroesophageal reflux disease and intermittent bowel habit changes.  Currently reporting diarrhea and states she has up to 7 loose bowel movements a day. Denies any rectal bleeding currently, rectal bleeding occurred for 3 days.  Patient also complains of intermittent right upper quadrant pain.  Describes this pain as sharp.  She also reports history of diverticulosis of the colon and colon polyps.   Colonoscopy 7 years ago was unremarkable except for polyps.  She acknowledges family history of colon cancer in maternal grandmother diagnosed in her 62s.  Patient states she was diagnosed with celiac disease and has been on gluten free diet for the past 26 years.    .She  denies any difficulty with swallowing,  nausea, vomiting, abdominal bloating, constipation, melena, hematemesis, unintentional weight loss or other complaints.  Of note patient states she was recently diagnosed with hereditary angioedema and is on Orladeyo.  Since taking this medication she noticed worsening diarrhea.    Review of Systems  See HPI.     Past Medical History: She  has a past medical history of Allergic rhinitis,  Benign paroxysmal positional vertigo, Diverticulosis of colon, Drug allergy, Fibroadenoma of breast, left, Food allergy, Frequent headaches, Hashimoto's thyroiditis, History of chronic sinusitis, Mast cell activation syndrome (CMS HCC), Other chronic allergic conjunctivitis, Otitis media, Pneumonia, Raynaud's syndrome, Sleep apnea, Stevens-Johnson disease (CMS HCC), and Thyroid disease.      Past Surgical History: She  has a past surgical history that includes hx tonsillectomy (1960); hx dilation and curettage (1982); Nasal turbinate reduction (2015); hx breast biopsy; and Breast surgery (2021).    Social History: Her  reports that she has quit smoking. Her smoking use included cigarettes. She has never used smokeless tobacco. She reports current alcohol use. She reports that she does not use drugs., ,     Family History: She family history includes Allergic rhinitis in her brother and sister; Allergy in her mother; Arthritis-rheumatoid in her father; COLITIS in her brother; Conjunctivitis in her mother; Drug Allergy in her mother; Food Allergy in her brother, mother, and sister; Heart Disease in her father and mother; Hypertension (High Blood Pressure) in her father and mother; Psoriasis in her father; Sinusitis in her mother; Swallowing difficulties in her mother; Thyroid Disease in her sister.    Allergies: She is allergic to keflet [cephalexin], ace inhibitors, advil [ibuprofen], amoxicillin, benadryl [  diphenhydramine hcl], benzocaine-sulfur, ciprofloxacin, clindamycin, erythromycin, levaquin [levofloxacin], penicillins, and sulfa (sulfonamides).    Home Medication:   Current Outpatient Medications   Medication Sig    berotralstat (ORLADEYO) 150 mg Oral Capsule Take 1 Capsule (150 mg total) by mouth Once a day for 90 days    EPINEPHrine (EPIPEN) 0.3 mg/0.3 mL Injection Auto-Injector Inject 0.3 mL (0.3 mg total) into the muscle Once, as needed for ANAPHYLAXIS    famotidine (PEPCID) 20 mg Oral Tablet Take 1 Tablet  (20 mg total) by mouth Twice daily    fexofenadine (ALLEGRA) 60 mg Oral Tablet Take 1 Tablet (60 mg total) by mouth Once a day    icatibant 30 mg/3 mL Subcutaneous Syringe Inject 1 syringe (30 mg total) under the skin Every 6 hours as needed (angioedema flare) do not exceed 3 doses in a 24 hour period    levothyroxine (SYNTHROID) 50 mcg Oral Tablet Take 1 Tablet (50 mcg total) by mouth Every morning       Examination:  BP (!) 143/85   Pulse 95   Ht 1.676 m (5\' 6" )   Wt 90 kg (198 lb 6.6 oz)   SpO2 (!) 56%   BMI 32.02 kg/m        Wt Readings from Last 3 Encounters:   01/06/23 90 kg (198 lb 6.6 oz)   10/08/22 88 kg (194 lb 0.1 oz)   09/26/22 87.9 kg (193 lb 12.6 oz)       General: Resting comfortably, no acute distress.  Eyes:  Conjunctiva normal, sclera non-icteric.  HENT:  Atraumatic and normocephalic.   Neck:  Supple. No thyromegaly.  Lungs:  Breathing comfortably.  No respiratory distress.  Cardiovascular:  Regular rate and regular rhythm; S1, S2 normal  Abdomen:  Soft, non-tender, Bowel sounds normal, non-distended  Extremities:  No cyanosis or edema  Skin:  Skin warm and dry. No jaundice  Neurologic:  Alert, Oriented, Grossly normal  Psychiatric:  Appears normal    Data/Chart reviewed:    Previous labs reviewed? no   Previous biopsy and pathology/EKG/Cardiac work up/outside records?  no   Previous imaging reviewed?  no   _________________________________________________________________________  Assessment and Plan  Catherine Pruitt is a 67 y.o. female with the following issues:    Recent history of rectal bleeding x 3 days, resolved. DDX:  Polyps, hemorrhoids, anal fissure, diverticular bleed.   History of colon diverticulosis  Bowel habit changes.  Currently complaining of diarrhea, reports having up to 7 loose stools a day.  Could be secondary to medication side effect.  Patient is on Orladeyo for hereditary angioedema and reports worsening diarrhea since starting this medication.  History of celiac  disease diagnosed 26 years ago.  Currently on strict gluten free diet  History of hiatal hernia  Chronic gastroesophageal reflux disease not well controlled on Pepcid  Family history colon cancer in maternal grandmother in her 63s.  Chronic Intermittent right upper quadrant pain.  RUQ ultrasound 2 years ago was unremarkable per patient.  Possibly musculoskeletal pain.     My recommendations are as follows:    Will proceed with EGD and colonoscopy with random colon biopsy.  I have discussed the risks and benefits of endoscopic procedures.  Risks include bleeding, infection, perforation, missing a polyp and risk of anesthesia.  Patient verbalized understanding and is willing to move forward.  Will check stool studies  Will check pancreatic elastase, fecal calprotectin  Iron panel, B12, folate, CBC, BMP and LFTs ordered  Will check  tTG and IgA  Avoid NSAIDs  Anti-reflux measures discussed with patient.   Avoid triggering foods  Elevate head of bed at night or use more pillows  Avoid eating 2-3 hours prior to bedtime   Start Protonix 20 mg p.o. daily.  Take it 30 minutes before breakfast  May take Pepcid at [facility] as needed.  For uncontrolled heartburn.    Recommend weight loss of at least 10 % of current body weight   If right upper quadrant abdominal pain does not improve will consider RUQ ultrasound next visit.  Metamucil or Benefiber b.i.d.  Imodium as needed for diarrhea  Continue strict gluten free diet.     Return to the clinic in 2 weeks post endoscopic procedures.    Orders Placed This Encounter    UPPER ENDOSCOPY ([place])    LOWER ENDOSCOPY ([place])    CLOSTRIDIUM DIFFICILE TOXIN DETECTION    OVA AND PARASITE SCREEN    GIARDIA/CRYPTOSPORIDIUM    IMMUNOGLOBULIN A (IGA), SERUM    TISSUE TRANSGLUTAMINASE (TTG) ANTIBODY, IGA, SERUM    DEAMIDATED GLIADIN PEPTIDE (DGP) IGA & IGG PROFILE    PANCREATIC ELASTASE, FECES    CALPROTECTIN,FECAL    CBC/DIFF    BASIC METABOLIC PANEL    HEPATIC FUNCTION PANEL    IRON    IRON  TRANSFERRIN AND TIBC    FERRITIN    VITAMIN B12    FOLATE    PEG 3350-Electrolytes 236-22.74-6.74 -5.86 gram Oral Recon Soln    Bisacodyl 5 mg Oral Tablet    pantoprazole (PROTONIX) 20 mg Oral Tablet, Delayed Release (E.C.)         Quentin Mulling, APRN,NP-C   [facility] - Gastroenterology  [place], [place]  [address]    Note: A portion of this documentation was generated using MMODAL (voice recognition software) and may contain syntax/voice recognition errors.         Attending Attestation :     I personally have not seen or personally examined the patient.    I only reviewed key elements of the HPI.  The assessment, plan of care, and recommendations were reviewed. I agree with the above assessment and plan with additions as mentioned below.    Recommend to call on-call GI physician for acute change in patients clinical condition that is related/suspected to GI etiologies. In a case of any medical emergency , come to ER for evaluation.     Note:   The above data may have been collected via chart review, discussion with other providers, review of medical records, discussion with the patients family/friends, and or obtained by the patient.  Not all of the above information was necessarily discussed with the patient.  The above stated documentation is for the medical record and communication/coordination of care for the patients best interest.  If the patient/family has any questions about the above stated material, this can be answered at the next scheduled visit or during hospital rounds if the patient is hospitalized.  This note was partially created using voice recognition software and is inherently subject to errors including those of syntax and "sound alike " substitutions which may escape proof reading.  In such instances, original meaning may be extrapolated by contextual derivation.    Alvester Chou, MD   [facility] - Gastroenterology  [place], [place]  [address]

## 2023-01-06 NOTE — Telephone Encounter (Addendum)
Order faxed again as requested.    Katha Cabal, LPN        ----- Message from Alphonzo Cruise sent at 01/06/2023  4:50 PM EDT -----  [name] Pt    [name] calling in berotralstat (ORLADEYO) 150 mg Oral Capsule. They are missing part of the script. Please send again. Thank you!

## 2023-01-06 NOTE — Telephone Encounter (Signed)
Opened in error. Eyva Califano, RN

## 2023-01-06 NOTE — Nursing Note (Signed)
New patient being seen for rectal bleeding

## 2023-01-06 NOTE — Telephone Encounter (Signed)
Received fax from [facility] for a refill request for Orladeyo. Orders signed and faxed back to [facility] with confirmation. Ammie Dalton, RN

## 2023-01-07 ENCOUNTER — Other Ambulatory Visit (HOSPITAL_BASED_OUTPATIENT_CLINIC_OR_DEPARTMENT_OTHER): Payer: Medicare Other | Admitting: Gynecology

## 2023-01-07 ENCOUNTER — Encounter (HOSPITAL_BASED_OUTPATIENT_CLINIC_OR_DEPARTMENT_OTHER): Payer: Self-pay | Admitting: Pediatric Allergy/Immunology

## 2023-01-07 ENCOUNTER — Ambulatory Visit: Payer: Medicare Other | Attending: Pediatric Allergy/Immunology | Admitting: Pediatric Allergy/Immunology

## 2023-01-07 VITALS — BP 138/85 | HR 76 | Temp 97.9°F | Ht 66.0 in | Wt 197.1 lb

## 2023-01-07 DIAGNOSIS — D841 Defects in the complement system: Secondary | ICD-10-CM | POA: Insufficient documentation

## 2023-01-07 DIAGNOSIS — K900 Celiac disease: Secondary | ICD-10-CM

## 2023-01-07 DIAGNOSIS — R197 Diarrhea, unspecified: Secondary | ICD-10-CM

## 2023-01-07 DIAGNOSIS — R1011 Right upper quadrant pain: Secondary | ICD-10-CM

## 2023-01-07 LAB — CBC WITH DIFF
BASOPHIL #: <0.1 10*3/uL (ref <=0.20)
BASOPHIL %: 0.4 %
EOSINOPHIL #: 0.19 10*3/uL (ref <=0.50)
EOSINOPHIL %: 4.1 %
HCT: 45.1 % (ref 34.8–46.0)
HGB: 14.6 g/dL (ref 11.5–16.0)
IMMATURE GRANULOCYTE #: <0.1 10*3/uL (ref <0.10)
IMMATURE GRANULOCYTE %: 0.2 % (ref 0.0–1.0)
LYMPHOCYTE #: 1.29 10*3/uL (ref 1.00–4.80)
LYMPHOCYTE %: 28 %
MCH: 29.6 pg (ref 26.0–32.0)
MCHC: 32.4 g/dL (ref 31.0–35.5)
MCV: 91.3 fL (ref 78.0–100.0)
MONOCYTE #: 0.49 10*3/uL (ref 0.20–1.10)
MONOCYTE %: 10.6 %
MPV: 9.1 fL (ref 8.7–12.5)
NEUTROPHIL #: 2.61 10*3/uL (ref 1.50–7.70)
NEUTROPHIL %: 56.7 %
PLATELETS: 308 10*3/uL (ref 150–400)
RBC: 4.94 10*6/uL (ref 3.85–5.22)
RDW-CV: 12.9 % (ref 11.5–15.5)
WBC: 4.6 10*3/uL (ref 3.7–11.0)

## 2023-01-07 LAB — BASIC METABOLIC PANEL
ANION GAP: 9 mmol/L (ref 4–13)
BUN/CREA RATIO: 18 (ref 6–22)
BUN: 18 mg/dL (ref 8–25)
CALCIUM: 10.4 mg/dL — ABNORMAL HIGH (ref 8.6–10.3)
CHLORIDE: 107 mmol/L (ref 96–111)
CO2 TOTAL: 25 mmol/L (ref 23–31)
CREATININE: 1 mg/dL (ref 0.60–1.05)
ESTIMATED GFR - FEMALE: 62 mL/min/BSA (ref >=60)
GLUCOSE: 83 mg/dL (ref 65–125)
POTASSIUM: 3.8 mmol/L (ref 3.5–5.1)
SODIUM: 141 mmol/L (ref 136–145)

## 2023-01-07 LAB — HEPATIC FUNCTION PANEL
ALBUMIN: 4.3 g/dL (ref 3.4–4.8)
ALKALINE PHOSPHATASE: 60 U/L (ref 55–145)
ALT (SGPT): 30 U/L — ABNORMAL HIGH (ref 8–22)
AST (SGOT): 24 U/L (ref 8–45)
BILIRUBIN DIRECT: 0.2 mg/dL (ref 0.1–0.4)
BILIRUBIN TOTAL: 0.6 mg/dL (ref 0.3–1.3)
PROTEIN TOTAL: 7.4 g/dL (ref 6.0–8.0)

## 2023-01-07 LAB — FERRITIN: FERRITIN: 77 ng/mL (ref 5–200)

## 2023-01-07 LAB — IRON TRANSFERRIN AND TIBC
IRON (TRANSFERRIN) SATURATION: 34 % (ref 15–50)
IRON: 134 ug/dL (ref 45–170)
TOTAL IRON BINDING CAPACITY: 396 ug/dL (ref 224–476)
TRANSFERRIN: 283 mg/dL (ref 160–340)

## 2023-01-07 LAB — VITAMIN B12: VITAMIN B 12: 639 pg/mL (ref 200–900)

## 2023-01-07 LAB — FOLATE: FOLATE: 12 ng/mL (ref 7.0–31.0)

## 2023-01-07 NOTE — Progress Notes (Signed)
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  They are currently taking Orladeyo 150mg  once daily and has to take with a lot of food or she gets sick. She has not used her Icatibant rescue, but has had tongue swelling.     One of her daughters Lupe Carney who lives in [place] has tested positive for low C1. Her other daughter was negative and her sister was negative.     Patient having colonoscopy in 9/24 at [facility]).     She needs dental cleaning.     She is having breast cyst removal at [facility] - but not scheduled.       ROS:  Constitutional: Denies fevers, chills, night sweats. No recent weight changes.   Eyes: Denies change in vision. No irritation, erythema, discharge or pain.   Ears: Denies: ear pain, or discharge.  Mouth/Throat: Denies any oral ulcers or other lesions. No sore throat, hoarseness or dysphagia.  Cardiovascular: Denies any chest pain, palpitations, PND or DOE  Respiratory: Denies any shortness of breath, wheezing, cough   GI: Denies any abdominal pain, nausea, vomiting, diarrhea or constipation.   Musculoskeletal: Denies any  muscle/joint pain or swelling.   Skin: see HPI      Past Medical History:  Patient Active Problem List    Diagnosis Date Noted    Sleep Apnea 01/31/2004       Family History:  Family History   Problem Relation Age of Onset    Drug Allergy Mother     Food Allergy Mother     Hypertension (High Blood Pressure) Mother     Heart Disease Mother     Conjunctivitis Mother     Allergy Mother     Swallowing difficulties Mother     Sinusitis Mother     Heart Disease Father     Hypertension (High Blood Pressure) Father     Psoriasis  Father     Arthritis-rheumatoid Father     Food Allergy Sister     Allergic rhinitis Sister     Thyroid Disease Sister     Food Allergy Brother     Allergic rhinitis Brother     COLITIS Brother        Medications:  Current Outpatient Medications   Medication Sig    EPINEPHrine (EPIPEN) 0.3 mg/0.3 mL Injection Auto-Injector Inject 0.3 mL (0.3 mg total) into the muscle Once, as needed for ANAPHYLAXIS    famotidine (PEPCID) 20 mg Oral Tablet Take 1 Tablet (20 mg total) by mouth Twice daily    fexofenadine (ALLEGRA) 60 mg Oral Tablet Take 1 Tablet (60 mg total) by mouth Once a day    icatibant 30 mg/3 mL Subcutaneous Syringe Inject 1 syringe (30 mg total) under the skin Every 6 hours as needed (angioedema flare) do not exceed 3 doses in a 24 hour period    levothyroxine (SYNTHROID) 50 mcg Oral Tablet Take 1 Tablet (50 mcg total) by mouth Every morning    pantoprazole (PROTONIX) 20 mg Oral Tablet, Delayed Release (E.C.) Take 1 Tablet (20 mg total)  by mouth Every morning before breakfast for 180 days        Allergies:  Allergies   Allergen Reactions    Keflet [Cephalexin] Rash    Ace Inhibitors      Has HAE type I.  Increased risk of Angioedema if on ACE inhibitors.  Has never had angioedema or allergic reaction to any ACE-inhibitor     Advil [Ibuprofen]     Amoxicillin     Benadryl [Diphenhydramine Hcl]      GIVES OPPOSITE REACTION    Benzocaine-Sulfur     Ciprofloxacin     Clindamycin     Erythromycin     Levaquin [Levofloxacin]     Penicillins     Sulfa (Sulfonamides) Stevens-Johnson Syndrome        Social History:  Social History     Tobacco Use    Smoking status: Former     Types: Cigarettes    Smokeless tobacco: Never   Substance Use Topics    Alcohol use: Yes     Comment: OCCASIONALLY    Drug use: Never          Objective:  BP 138/85   Pulse 76   Temp 36.6 C (97.9 F)   Ht 1.676 m (5\' 6" )   Wt 89.4 kg (197 lb 1.5 oz)   SpO2 94%   BMI 31.81 kg/m          Physical Exam:  General: No acute distress  Eyes:  EOMI, Conjunctiva are clear. No discharge or icterus noted.  HENT: Mucous membranes are moist. Nares are no rhinorrhea. Posterior oropharynx is without post nasal drip.    Extremities: Atraumatic. No cyanosis. No significant peripheral edema.  Skin: No rashes or lesions  Neurologic: Strength and sensation grossly normal throughout. Normal gait.    Psychiatric:  Affect within normal limits.  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      Great response to [place] 150mg  once daily.  Tolerance is well if taking with large meals.     Continue Icatibant rescue every 6 hours as needed up to 3 doses in a 24 hour period.     Follow-up in 4-6 months or sooner if needed    Blanca Friend, DO 01/31/2023, 19:11        Portions of this note may be dictated using voice recognition software. Variances in spelling and vocabulary are possible and unintentional. Not all errors are caught/corrected. Please notify the Thereasa Parkin if any discrepancies are noted or if the meaning of any statement is not clear.

## 2023-01-08 LAB — DEAMIDATED GLIADIN PEPTIDE (DGP) IGA & IGG PROFILE
GLIADIN (DEAMIDATED) ANTIBODY IGA QUALITATIVE: NEGATIVE
GLIADIN (DEAMIDATED) ANTIBODY IGA QUANTITATIVE: 0.5 U/mL (ref <15.0)
GLIADIN (DEAMIDATED) ANTIBODY IGG QUALITATIVE: NEGATIVE
GLIADIN (DEAMIDATED) ANTIBODY IGG QUANTITATIVE: <0.4 U/mL (ref <15.0)

## 2023-01-08 LAB — IMMUNOGLOBULIN A (IGA), SERUM: IMMUNOGLOBULIN A (IGA): 214 mg/dL (ref 85–499)

## 2023-01-08 LAB — TISSUE TRANSGLUTAMINASE (TTG) ANTIBODY, IGA, SERUM
TISSUE TRANSGLUTAMINASE ANTIBODIES IGA QUALITATIVE: NEGATIVE
TISSUE TRANSGLUTAMINASE ANTIBODIES IGA QUANTITATIVE: <0.5 U/mL (ref <15.0)

## 2023-01-09 ENCOUNTER — Encounter (HOSPITAL_BASED_OUTPATIENT_CLINIC_OR_DEPARTMENT_OTHER): Payer: Self-pay | Admitting: Gastroenterology

## 2023-01-10 ENCOUNTER — Ambulatory Visit (HOSPITAL_BASED_OUTPATIENT_CLINIC_OR_DEPARTMENT_OTHER): Payer: Self-pay | Admitting: Pediatric Allergy/Immunology

## 2023-01-10 NOTE — Telephone Encounter (Signed)
Refill order faxed to [facility] successfully.   Estell Harpin, RN

## 2023-01-10 NOTE — Telephone Encounter (Signed)
Regarding: refill  ----- Message from Glenford Peers sent at 01/10/2023 10:22 AM EDT -----  Delorise Shiner calling needing refill on berotralstat (ORLADEYO) 150 mg Oral Capsule. Please call if need to discuss    Delorise Shiner  [phone]    Fax  [phone]    Preferred Pharmacy     [facility] - [place], [place] - [address]    [address] [place] [address]    Phone: [phone] Fax: [phone]    Hours: Not open 24 hours

## 2023-01-13 ENCOUNTER — Other Ambulatory Visit: Payer: Self-pay

## 2023-01-21 ENCOUNTER — Other Ambulatory Visit: Payer: Self-pay

## 2023-01-31 ENCOUNTER — Encounter (HOSPITAL_BASED_OUTPATIENT_CLINIC_OR_DEPARTMENT_OTHER): Payer: Self-pay | Admitting: Pediatric Allergy/Immunology

## 2023-02-05 ENCOUNTER — Other Ambulatory Visit: Payer: Self-pay

## 2023-02-05 ENCOUNTER — Other Ambulatory Visit: Payer: Self-pay | Admitting: Pharmacist

## 2023-02-05 NOTE — Telephone Encounter (Signed)
Specialty Pharmacy Follow-up Assessment Note    Date of assessment: 02/05/2023  Patient: Catherine Pruitt is a 67 y.o. female  Specialty Medication:  icatibant    Subjective:  Disease state: Symptom report: patient noted flare where she went to ER and took first dose of icatibant.  She had swelling on foot, scalp, and throat.  States better after 1 shot but some swelling. She questions taking another dose.    Reports symptoms are no different since last assessment.   Denies missed days from planned activities due to disease.   Denies recent hospitalizations/ER/urgent care visits in the past month related to diagnosis.    Patient reported goal includes: treat HAE flares  Side effects: none  Adherence: Denies any missed doses. Reports using routine as a method of adherence.  Medication list reported changes:  none    Objective:  Pertinent labs include:   reviewed    Assessment:  Disease state: Medication appropriate with therapeutic goal of medication efficacy and tolerability.    Side effects: none  Adherence: Patient report of medication on hand and fill records demonstrate adherence.   Medication list: up to date    Plan:   Disease state: Continue therapy: Patient declined further medication education.  Pharmacist will continue to provide periodic follow-up.  Pati  Side effects: continue to monitor  Adherence: Reminded patient of methods of adherence and scheduled medication delivery as below.   Medication list: updated list    >02/05/2023 11:56 - Reginia Forts, [facility]    Delivery Date: 6/6 via courier at address [address] [place] to be ups if needed  Payment: Card NOT Charged - No Copay  Supplies: none  Signature Required: Sig required, [employer]: n/a  Insurance verified: verified    Therigy assessment complete. Spoke with patient, who confirmed med/dosage. Scheduled the following medication(s) icatibant.

## 2023-02-09 ENCOUNTER — Encounter (HOSPITAL_BASED_OUTPATIENT_CLINIC_OR_DEPARTMENT_OTHER): Payer: Self-pay | Admitting: Pediatric Allergy/Immunology

## 2023-02-10 ENCOUNTER — Encounter (HOSPITAL_BASED_OUTPATIENT_CLINIC_OR_DEPARTMENT_OTHER): Payer: Self-pay | Admitting: Pediatric Allergy/Immunology

## 2023-03-10 ENCOUNTER — Other Ambulatory Visit: Payer: Self-pay

## 2023-03-10 ENCOUNTER — Other Ambulatory Visit: Payer: Self-pay | Admitting: Pharmacist

## 2023-03-10 NOTE — Telephone Encounter (Signed)
Spoke with Catherine Pruitt regarding a recent flare that she experienced on 7/7. States she had swelling and pain in her foot which progressed to her knee. She did not take icatibant at that time. Her symptoms spread to her tongue and throat, at that point she took two doses of icatibant and feels that her symptoms were controlled at that time. She continues Orladeyo daily for prevention. She gave her injections in her abdomen and has a sharps container for disposal of used syringes. She is currently visiting her daughter in  and disposed of the used syringes in a plastic bottle.

## 2023-03-11 ENCOUNTER — Other Ambulatory Visit: Payer: Self-pay

## 2023-03-12 ENCOUNTER — Other Ambulatory Visit: Payer: Self-pay

## 2023-03-24 ENCOUNTER — Other Ambulatory Visit: Payer: Self-pay

## 2023-03-24 ENCOUNTER — Other Ambulatory Visit (HOSPITAL_BASED_OUTPATIENT_CLINIC_OR_DEPARTMENT_OTHER): Payer: Self-pay | Admitting: Pediatric Allergy/Immunology

## 2023-03-24 ENCOUNTER — Encounter (HOSPITAL_BASED_OUTPATIENT_CLINIC_OR_DEPARTMENT_OTHER): Payer: Self-pay | Admitting: Pediatric Allergy/Immunology

## 2023-03-24 MED ORDER — ICATIBANT 30 MG/3 ML SUBCUTANEOUS SYRINGE
1.0000 | INJECTION | Freq: Four times a day (QID) | SUBCUTANEOUS | 5 refills | Status: DC | PRN
Start: 2023-03-24 — End: 2023-10-15
  Filled 2023-03-25: qty 9, 1d supply, fill #0
  Filled 2023-03-28 – 2023-05-21 (×2): qty 9, 1d supply, fill #1
  Filled 2023-06-19: qty 9, 1d supply, fill #2
  Filled 2023-07-08: qty 9, 1d supply, fill #3
  Filled 2023-07-24: qty 18, 2d supply, fill #4

## 2023-03-24 NOTE — Telephone Encounter (Signed)
Refill Request: [facility]  Pharmacy requests refill of Icatibant.    1.  Last visit at [facility]:  01/07/2023  2.  Last My Telemed/ My Chart Visit: Visit date not found   3.  Next pending visit [facility]: 06/16/2023  4.  Was last visit within the past year: Yes  5.  If not seen within the last year was appointment scheduled: N/A  6.  Requested Pharmacy: [facility]    Medication pended to provider for approval.    Debroah Baller, RN 03/24/2023, 12:44

## 2023-03-25 ENCOUNTER — Other Ambulatory Visit: Payer: Self-pay

## 2023-03-25 ENCOUNTER — Other Ambulatory Visit: Payer: Self-pay | Admitting: Pharmacist

## 2023-03-25 NOTE — Telephone Encounter (Signed)
Spoke with Catherine Pruitt today regarding a flare that she is currently in. Reports flare started on Sunday 7/21, she took her first dose of icatibant at that time. Reports throat, knee, and foot swelling as well as "IBS type symptoms". States the first dose controlled her throat swelling but she has not administered a second dose as of yet. A refill request was pending for this fill and she wanted to make sure she had more on the way before she took a second dose. Scheduled delivery to arrive Thursday 7/25. Patient continues Orladeyo for prevention.    Guy Sandifer, [facility]  03/25/2023, 16:50

## 2023-03-26 ENCOUNTER — Other Ambulatory Visit: Payer: Self-pay

## 2023-03-28 ENCOUNTER — Other Ambulatory Visit: Payer: Self-pay

## 2023-04-02 ENCOUNTER — Encounter (HOSPITAL_BASED_OUTPATIENT_CLINIC_OR_DEPARTMENT_OTHER): Payer: Self-pay | Admitting: Pediatric Allergy/Immunology

## 2023-04-03 ENCOUNTER — Other Ambulatory Visit (HOSPITAL_BASED_OUTPATIENT_CLINIC_OR_DEPARTMENT_OTHER): Payer: Self-pay | Admitting: Pediatric Allergy/Immunology

## 2023-04-03 MED ORDER — DANAZOL 100 MG CAPSULE
400.0000 mg | ORAL_CAPSULE | Freq: Every day | ORAL | 0 refills | Status: DC
Start: 2023-04-03 — End: 2023-04-14

## 2023-04-04 ENCOUNTER — Other Ambulatory Visit (HOSPITAL_BASED_OUTPATIENT_CLINIC_OR_DEPARTMENT_OTHER): Payer: Self-pay | Admitting: Pediatric Allergy/Immunology

## 2023-04-04 NOTE — Telephone Encounter (Signed)
Ordered on 04/03/23

## 2023-04-10 ENCOUNTER — Encounter (HOSPITAL_BASED_OUTPATIENT_CLINIC_OR_DEPARTMENT_OTHER): Payer: Self-pay | Admitting: Pediatric Allergy/Immunology

## 2023-04-14 NOTE — Telephone Encounter (Signed)
Refill Request: [facility]  Patient requests refill of Danazol.    1.  Last visit at [facility]:  01/07/2023  2.  Last My Telemed/ My Chart Visit: Visit date not found   3.  Next pending visit [facility]: 06/16/2023  4.  Was last visit within the past year: Yes  5.  If not seen within the last year was appointment scheduled: N/A  6.  [facility]    Medication pended to provider for approval.    Debroah Baller, RN 04/14/2023, 14:37

## 2023-04-16 ENCOUNTER — Encounter (HOSPITAL_BASED_OUTPATIENT_CLINIC_OR_DEPARTMENT_OTHER): Payer: Self-pay | Admitting: Pediatric Allergy/Immunology

## 2023-04-16 ENCOUNTER — Other Ambulatory Visit: Payer: Self-pay

## 2023-04-16 MED ORDER — DANAZOL 200 MG CAPSULE
400.0000 mg | ORAL_CAPSULE | Freq: Every day | ORAL | 2 refills | Status: DC
Start: 2023-04-16 — End: 2024-04-20
  Filled 2023-04-16: qty 14, 7d supply, fill #0
  Filled 2023-11-27: qty 14, 7d supply, fill #1

## 2023-04-18 ENCOUNTER — Other Ambulatory Visit: Payer: Self-pay

## 2023-04-18 ENCOUNTER — Other Ambulatory Visit: Payer: Medicare Other

## 2023-04-18 DIAGNOSIS — R197 Diarrhea, unspecified: Secondary | ICD-10-CM

## 2023-04-18 LAB — GIARDIA/CRYPTOSPORIDIUM
CRYPTOSPORIDIUM ANTIGEN: NEGATIVE
GIARDIA ANTIGEN: NEGATIVE

## 2023-04-18 LAB — CLOSTRIDIUM DIFFICILE TOXIN DETECTION
C. DIFFICILE INTERPRETATION: NEGATIVE
C. DIFFICILE TOXIN GENE, PCR: NEGATIVE

## 2023-04-21 LAB — OVA AND PARASITE SCREEN: OVA & PARASITE SCREEN TRICHROME: NONE SEEN

## 2023-04-23 ENCOUNTER — Telehealth (HOSPITAL_BASED_OUTPATIENT_CLINIC_OR_DEPARTMENT_OTHER): Payer: Self-pay | Admitting: Gastroenterology

## 2023-04-23 NOTE — Telephone Encounter (Signed)
Pt's procedure is cancelled at this time.    Spoke to pt:  no    Procedure:  Flip  Original date/time:  05/08/2023   Provider:  Dr. Neysa Bonito  Reason:  Having breast surgery    Sx removed from original date/time:  yes  Sx sched aware:  Yes  Packet moved to cancelled folder:  Yes    Follow up appointment cancelled:  Yes    Comments:  Pt left voice message cancelling procedures, I returned call left voice message to call back if she would like to reschedule.

## 2023-04-25 LAB — CALPROTECTIN,FECAL: CALPROTECTIN, STOOL: 10 mcg/g

## 2023-04-26 LAB — PANCREATIC ELASTASE, FECES: PANCREATIC ELASTASE-1: >500 mcg/g

## 2023-05-08 ENCOUNTER — Encounter (HOSPITAL_COMMUNITY): Payer: Self-pay

## 2023-05-08 ENCOUNTER — Inpatient Hospital Stay (HOSPITAL_COMMUNITY): Admit: 2023-05-08 | Payer: Medicare Other | Admitting: Gastroenterology

## 2023-05-08 SURGERY — GASTROSCOPY
Anesthesia: Monitor Anesthesia Care

## 2023-05-21 ENCOUNTER — Other Ambulatory Visit: Payer: Self-pay

## 2023-05-21 NOTE — Telephone Encounter (Signed)
Specialty Pharmacy Note    Date of assessment: 05/21/2023  Patient: Catherine Pruitt is a 67 y.o. female  Diagnosis: HAE  Specialty Medication(s): Icatibant      Subjective:  Received call from patient today. She reports a flare. Started in her eyes yesterday and she probably should have taken a dose of icatibant then, but the flare progressed to her neck, tongue and throat, so she took her first dose today.  She has two doses remaining.  Reports last flare was approximately a month ago. She did have a previous injection that was more painful and bruised.  The bruise did resolve in a few days without issue.    Objective:  Pertinent labs include: None    Assessment:  Reviewed dispensing records.  Her last flare was on 7/21, almost 2 months ago.  Patient is compliant with calling for a refill when she uses the first of her 3 syringes.     Plan:   Continue therapy: therapy appropriate. Reviewed injection site reactions as a side effect. Patient has a lot of stretch marks in her abdomen.  Counseled her to try to avoid these.  OK to administer icatibant on the sides of her belly button and above if necessary, avoiding the belly button by two inches.  Reviewed injection technique. Patient is pinching up skin. Patient has been injecting at a 90 degree angle, which is not incorrect, but counseled her to try an angle between 45 and 90 degrees as this may help.      Scheduled delivery of icatibant for Wednesday 9/18 for Thursday 9/19 with [employer]. Scheduled pharmacist follow-up for November.    Patient expressed no additional questions and was encouraged to contact [facility] with any questions or concerns.     Phillips Odor, RPh  05/21/2023, 10:27

## 2023-05-26 ENCOUNTER — Ambulatory Visit (HOSPITAL_BASED_OUTPATIENT_CLINIC_OR_DEPARTMENT_OTHER): Payer: Medicare Other

## 2023-05-27 ENCOUNTER — Ambulatory Visit (HOSPITAL_BASED_OUTPATIENT_CLINIC_OR_DEPARTMENT_OTHER): Payer: Self-pay | Admitting: Pediatric Allergy/Immunology

## 2023-05-27 NOTE — Telephone Encounter (Signed)
Received request from Ebbie Ridge at [facility] requesting clinic note for 01/07/23. Office note from 01/07/23 faxed to [facility] at fax # [phone]. Fax confirmation received.    Debroah Baller, RN

## 2023-05-27 NOTE — Telephone Encounter (Addendum)
Re-faxed clinic note from 01/07/23 to Shanda Bumps at [facility] at fax # [phone]. Fax confirmation received.    Debroah Baller, RN      Regarding: Clinical Question  ----- Message from Leonides Grills sent at 05/27/2023  3:21 PM EDT -----  Copied From CRM [id number], Basia J called with a clinical question.     Shanda Bumps stating that the office notes that she got from visit on 01/07/23 are missing pages. Please call to discuss    Fax  [phone]

## 2023-06-16 ENCOUNTER — Encounter (HOSPITAL_BASED_OUTPATIENT_CLINIC_OR_DEPARTMENT_OTHER): Payer: Self-pay | Admitting: Pediatric Allergy/Immunology

## 2023-06-16 ENCOUNTER — Ambulatory Visit: Payer: Medicare Other | Attending: Pediatric Allergy/Immunology | Admitting: Pediatric Allergy/Immunology

## 2023-06-16 ENCOUNTER — Ambulatory Visit (HOSPITAL_BASED_OUTPATIENT_CLINIC_OR_DEPARTMENT_OTHER): Payer: Self-pay | Admitting: Pediatric Allergy/Immunology

## 2023-06-16 DIAGNOSIS — D841 Defects in the complement system: Secondary | ICD-10-CM

## 2023-06-16 DIAGNOSIS — K589 Irritable bowel syndrome without diarrhea: Secondary | ICD-10-CM

## 2023-06-16 NOTE — Progress Notes (Signed)
[facility]  [address]  [place] [address]  Operated by [facility]  Video Visit     Name: Catherine Pruitt  MRN: [id number]    Date: 06/16/2023  Age: 67 y.o.                            Patient's location: [place] [address]   Patient/family aware of provider location: Yes  Patient/family consent for video visit: Yes  Interview and observation performed by: Blanca Friend, DO    Chief Complaint: Swelling    History of Present Illness:  Catherine Pruitt is a 67 y.o. female     angioedema:  Patient notes that they have had improved control of their angioedema and IBS symptoms over the past 3 months.  They are currently taking Orladeyo 150mg  once daily and has to take with a lot of food.     She has used her Icatibant rescue about 2-3 times a month, but has had  walking on gulf balls, tongue swelling, feels throaty and knees swelling. Within 30 minutes to 1 hours after Icatibant she starts to feel better.     Patient states that she has not had her surgical procedure yet and has not tried danazol.      Patient also states she has a colonoscopy coming up in early November.     Current Outpatient Medications   Medication Sig    berotralstat (ORLADEYO) 150 mg Oral Capsule Take 150 mg by mouth Once a day    EPINEPHrine (EPIPEN) 0.3 mg/0.3 mL Injection Auto-Injector Inject 0.3 mL (0.3 mg total) into the muscle Once, as needed for ANAPHYLAXIS    famotidine (PEPCID) 20 mg Oral Tablet Take 1 Tablet (20 mg total) by mouth Twice daily    fexofenadine (ALLEGRA) 60 mg Oral Tablet Take 1 Tablet (60 mg total) by mouth Once a day    icatibant 30 mg/3 mL Subcutaneous Syringe Inject 1 syringe (30 mg total) under the skin Every 6 hours as needed (angioedema flare) do not exceed 3 doses in a 24 hour period    levothyroxine (SYNTHROID) 50 mcg Oral Tablet Take 1 Tablet (50 mcg total) by mouth Every morning    pantoprazole (PROTONIX) 20 mg Oral Tablet, Delayed Release (E.C.) Take 1  Tablet (20 mg total) by mouth Every morning before breakfast for 180 days     Allergies   Allergen Reactions    Keflet [Cephalexin] Rash    Ace Inhibitors      Has HAE type I.  Increased risk of Angioedema if on ACE inhibitors.  Has never had angioedema or allergic reaction to any ACE-inhibitor     Advil [Ibuprofen]     Amoxicillin     Benadryl [Diphenhydramine Hcl]      GIVES OPPOSITE REACTION    Benzocaine-Sulfur     Ciprofloxacin     Clindamycin     Erythromycin     Levaquin [Levofloxacin]     Penicillins     Sulfa (Sulfonamides) Stevens-Johnson Syndrome     Past Medical History:   Diagnosis Date    Allergic rhinitis     Benign paroxysmal positional vertigo     Diverticulosis of colon     Drug allergy     Fibroadenoma of breast, left     Food allergy     Frequent headaches     Hashimoto's thyroiditis     History of chronic  sinusitis     Mast cell activation syndrome (CMS HCC)     Other chronic allergic conjunctivitis     Otitis media     Pneumonia     Raynaud's syndrome     Sleep apnea     Stevens-Johnson disease (CMS HCC)     Thyroid disease          Past Surgical History:   Procedure Laterality Date    BREAST SURGERY  2021    TO REMOVE ABCESS/CELLULITIS    HX BREAST BIOPSY      3 BREAST BIOPSIES-FIBRO EDENOMA    HX DILATION AND CURETTAGE  1982    MISCARRIAGE    HX TONSILLECTOMY  1960    TROUBLE BREATHING    NASAL TURBINATE REDUCTION  2015    BY ABLATION- TROUBLE BREATHING         Family Medical History:       Problem Relation (Age of Onset)    Allergic rhinitis Sister, Brother    Allergy Mother    Arthritis-rheumatoid Father    COLITIS Brother    Conjunctivitis Mother    Drug Allergy Mother    Food Allergy Mother, Sister, Brother    Heart Disease Mother, Father    Hypertension (High Blood Pressure) Mother, Father    Psoriasis Father    Sinusitis Mother    Swallowing difficulties Mother    Thyroid Disease Sister            Social History     Socioeconomic History    Marital status: Married    Number of children:  5    Years of education: 14   Occupational History    Occupation: RETIRED   Tobacco Use    Smoking status: Former     Types: Cigarettes    Smokeless tobacco: Never   Substance and Sexual Activity    Alcohol use: Yes     Comment: OCCASIONALLY    Drug use: Never         Review of Systems:  Constitutional: Denies fevers, chills, or fatigue.   Eyes: Denies irritation, erythema, discharge or pain.   Ears: Denies any ear pain/tugging or discharge.  Nose: Denies nasal congestion, purulent rhinorrhea,  nose bleeds   Mouth/Throat: Denies any oral ulcers or other lesions.   Cardiovascular: Denies any cyanosis, murmurs, periorbital edema   Respiratory: Denies any shortness of breath, wheezing, cough   GI: Denies any abdominal pain, vomiting, diarrhea or constipation.   Skin: Denies: skin rashes or lesions    Observational Exam:   General: No acute distress  Eyes: EOMI, Conjunctiva are clear. No discharge or icterus noted.  HENT: Mucous membranes are moist. External Nares are without crusted rhinorrhea  Lungs: symmetrical Chest rise b/l  Cardiovascular:  No cyanosis.   Skin: no rashes or lesions on face, neck lower arms or hands  Neurologic: grossly normal.  Psychiatric: Affect within normal limits.    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  Tolerance is well if taking with large meals.      Continue Icatibant rescue every 6 hours as needed up to 3 doses in a 24 hour period.     Danazol prior to surgeries.  If unable to tolerate patient to contact the office and wife to coordinate for Berinert be available for prophylaxis prior to surgical procedures.  Follow-up in 4-6 months or sooner if needed    Blanca Friend, DO

## 2023-06-16 NOTE — Telephone Encounter (Signed)
Received fax from Ebbie Ridge at [facility] requesting clinic note from 06/16/23. Clinic note faxed to [facility] at fax # [phone]. Fax confirmation received.    Debroah Baller, RN

## 2023-06-19 ENCOUNTER — Other Ambulatory Visit: Payer: Self-pay

## 2023-06-19 NOTE — Telephone Encounter (Addendum)
Patient called to report a HAE attack and she used one icatibant syringe. Scheduled delivery for 3 more syringes.

## 2023-06-20 ENCOUNTER — Other Ambulatory Visit: Payer: Self-pay

## 2023-07-08 ENCOUNTER — Other Ambulatory Visit: Payer: Self-pay | Admitting: Pharmacist

## 2023-07-08 ENCOUNTER — Other Ambulatory Visit: Payer: Self-pay

## 2023-07-08 NOTE — Telephone Encounter (Signed)
Pt called into specialty pharmacy for refill of icatibant. She used 1 syringe about 15 minutes ago for HAE flare. Reports her tongue is starting to swell and tingle. She is aware when to visit ED for symptoms. Recalls last ED visit was 6+ months ago.    She has #2 syringes left on hand today. Pharmacy sending another #3 syringes for delivery tmrw via [employer].    >07/08/2023 13:33 - Ave Filter, [facility]    Delivery Date: today 11/5 for Wed 11/6 via [employer] at address [address]  Payment: Card Charged $16 to pers CC [id number]   Supplies: none  Signature Required: No sig required, RPO  Animal: n/a    Therigy assessment complete. Spoke with patient, who confirmed med/dosage. Scheduled the following medication(s) icatibant. Reports no changes since the last delivery and n/a missed doses. Patient has #2 syringes remaining and will need current delivery by asap (needs #3 syringes on hand at all times to treat flares). No questions/concerns for the pharmacist.

## 2023-07-09 ENCOUNTER — Other Ambulatory Visit: Payer: Self-pay

## 2023-07-09 ENCOUNTER — Other Ambulatory Visit (HOSPITAL_BASED_OUTPATIENT_CLINIC_OR_DEPARTMENT_OTHER): Payer: Self-pay

## 2023-07-09 MED ORDER — PANTOPRAZOLE 20 MG TABLET,DELAYED RELEASE
20.0000 mg | DELAYED_RELEASE_TABLET | Freq: Every morning | ORAL | 1 refills | Status: AC
Start: 2023-07-09 — End: 2024-01-05

## 2023-07-24 ENCOUNTER — Other Ambulatory Visit: Payer: Self-pay

## 2023-07-24 ENCOUNTER — Other Ambulatory Visit: Payer: Self-pay | Admitting: Pharmacist

## 2023-07-24 NOTE — Telephone Encounter (Signed)
Pt called for refill of icatibant. She has 2 syringes left (took 1 today).    Reports she has been having HAE flare ups about every 7-10 days, but icatibant has worked to relieve symptoms within a couple of hours. Denies trips to ED for symptoms.    She feels the current flare was brought on by abscessed tooth (currently on doxycycline).    Changing disp qty from 9 mL to 18 mL per pt request since insurance will cover it. Shipping team is able to take down to [employer] store today for pt to have tmrw 11/22.    >07/24/2023 16:36 - Ave Filter, [facility]    Delivery Date: today 11/21 for Fri 11/22 via UPS at address [address] (okay per Delice Bison; someone will take down to [employer] store)  Payment: Card Charged $16 to pers CC [id number]   Supplies: none  Signature Required: No sig required, RPO  Animal: n/a    Therigy assessment complete. Spoke with patient, who confirmed med/dosage. Scheduled the following medication(s) icatibant. Patient has #2 syringes remaining and will need current delivery by asap to have #3 on hand at all times. No questions/concerns for the pharmacist.

## 2023-08-22 ENCOUNTER — Other Ambulatory Visit: Payer: Self-pay

## 2023-10-15 ENCOUNTER — Other Ambulatory Visit (HOSPITAL_BASED_OUTPATIENT_CLINIC_OR_DEPARTMENT_OTHER): Payer: Self-pay | Admitting: Pediatric Allergy/Immunology

## 2023-10-15 ENCOUNTER — Other Ambulatory Visit: Payer: Self-pay

## 2023-10-16 MED ORDER — ICATIBANT 30 MG/3 ML SUBCUTANEOUS SYRINGE
1.0000 | INJECTION | Freq: Four times a day (QID) | SUBCUTANEOUS | 5 refills | Status: DC | PRN
Start: 2023-10-16 — End: 2024-03-17
  Filled 2023-10-17: qty 18, 2d supply, fill #0
  Filled 2023-12-12: qty 18, 2d supply, fill #1
  Filled 2024-02-18: qty 18, 2d supply, fill #2

## 2023-10-16 NOTE — Telephone Encounter (Signed)
Refill Request: [facility]  Patient requests refill of Icatibant.    1.  Last visit at [facility]:  01/07/2023  2.  Last My Telemed/ My Chart Visit: 06/16/2023 Audrie Lia, DO   3.  Next pending visit [facility]: 11/24/2023  4.  Was last visit within the past year: Yes  5.  If not seen within the last year was appointment scheduled: N/A  6.  Requested Pharmacy: [facility]    Medication pended to provider for approval.    Debroah Baller, RN 10/16/2023, 10:54

## 2023-10-17 ENCOUNTER — Other Ambulatory Visit: Payer: Self-pay

## 2023-10-17 ENCOUNTER — Other Ambulatory Visit: Payer: Self-pay | Admitting: Pharmacist

## 2023-10-17 NOTE — Telephone Encounter (Signed)
Specialty Pharmacy Follow Up Note    Date of assessment: 10/17/2023  Patient: Catherine Pruitt is a 68 y.o. female  Diagnosis: HAE Type 1  Specialty Medication: icatibant 30 mg syringe SC every 6 hours as needed; do not exceed 3 doses in a 24 hour period   Prescriber: Blanca Friend, DO     Subjective:  Spoke with patient today for semi-annual follow-up regarding icatibant.    Disease state: HAE:  Symptom report: she had a flare last night around 3a. She took 1 dose of icatibant but says she should have taken 2 to get the flare to resolve faster.   Flares in the past month: Yes  Hospitalization for HAE flares in the past month: No  Triggers: stress, high histamine foods, corn, wheat  Patient has 2 unexpired on-demand therapies on hand: Yes  Prophylaxis therapy: Orladeyo (berotralstat)  Consider all the ways illness and health conditions affect you at this time, please provide a general score to indicate how you're doing (0=very well, 10=very poor): n/a  Missed days from work, school or planned activities due to disease in the past 4 weeks: Yes  Patient reported goal includes: control flares   Side Effects: none.   Adherence: n/a to this medication   Medication list reported changes:  added vit B complex    Objective:  Pertinent labs and monitoring parameters include: n/a     Assessment/Plan:  Disease state: medication appropriate to continue with therapeutic goal of: decrease duration and severity of attacks. Patient declined review of  medication today .   Side effects: none   Adherence: n/a to this medication  Medication list: reviewed with patient; up-to-date as of today  Notable drug-interactions or contraindications: none.  Scheduled delivery of icatibant for Tues 2/18 (sending #6 syringes per patient request). Scheduled pharmacist follow up for 6 months or sooner as needed.     Patient expressed no additional questions and was encouraged to contact [facility] with any questions or concerns.     Ave Filter, PHARMD 10/17/2023, 14:25

## 2023-10-20 ENCOUNTER — Other Ambulatory Visit: Payer: Self-pay

## 2023-10-21 ENCOUNTER — Other Ambulatory Visit: Payer: Self-pay

## 2023-10-27 ENCOUNTER — Other Ambulatory Visit: Payer: Self-pay

## 2023-10-28 ENCOUNTER — Other Ambulatory Visit: Payer: Self-pay

## 2023-10-30 ENCOUNTER — Other Ambulatory Visit: Payer: Self-pay

## 2023-10-31 ENCOUNTER — Other Ambulatory Visit: Payer: Self-pay

## 2023-11-04 ENCOUNTER — Ambulatory Visit (HOSPITAL_BASED_OUTPATIENT_CLINIC_OR_DEPARTMENT_OTHER): Payer: Self-pay | Admitting: Pediatric Allergy/Immunology

## 2023-11-04 ENCOUNTER — Encounter (HOSPITAL_BASED_OUTPATIENT_CLINIC_OR_DEPARTMENT_OTHER): Payer: Self-pay | Admitting: Pediatric Allergy/Immunology

## 2023-11-04 NOTE — Telephone Encounter (Addendum)
 Orladeyo orders faxed to [facility] at fax # [phone]. Fax confirmation received.    Debroah Baller, RN      Regarding: Refill Request  ----- Message from Fara Boros sent at 11/04/2023 10:38 AM EST -----  Copied From CRM #[id number].Delorise Shiner- [facility] () called to request a prescription refill.   berotralstat (ORLADEYO) 150 mg Oral Capsule  Please fax to [phone] or call for a verbal rx.  Thank you

## 2023-11-12 ENCOUNTER — Other Ambulatory Visit: Payer: Self-pay

## 2023-11-24 ENCOUNTER — Ambulatory Visit: Payer: Medicare Other | Admitting: Pediatric Allergy/Immunology

## 2023-11-24 ENCOUNTER — Ambulatory Visit: Attending: Pediatric Allergy/Immunology | Admitting: Pediatric Allergy/Immunology

## 2023-11-24 ENCOUNTER — Encounter (HOSPITAL_BASED_OUTPATIENT_CLINIC_OR_DEPARTMENT_OTHER): Payer: Self-pay | Admitting: Pediatric Allergy/Immunology

## 2023-11-24 DIAGNOSIS — D841 Defects in the complement system: Secondary | ICD-10-CM

## 2023-11-24 NOTE — Progress Notes (Signed)
 ALLERGY/IMMUNOLOGY, [facility]  [address]  [place] [address]  Operated by [facility]  Video Visit     Name: Catherine Pruitt  MRN: [id number]    Date: 11/24/2023  Age: 68 y.o.      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 She continues to take Orladeyo  150 mg once daily; denies any issues with negative side effects or medication compliance. She explains that she used to struggle with difficulty breathing and has not had any breathing issues since starting Orladeyo . She has had to use her Icatibant  medication 2-3 times monthly in the interim. This is helpful to treat the pain in her legs and swelling of her tongue when taken as needed. Her tongue becomes so swollen at times at she cannot fit in back into her mouth. Exposure to her known allergenic triggers really exacerbate her symptoms. Within 30 minutes to 1 hours after Icatibant  she starts to feel better. Catherine Pruitt also reports that she has trialed off of her H1 and H2 blockers which caused her to develop a hive flare-up. She then restarted these medications and has not had any breakthrough hives. Lempi has recently learned more about an alternative to her Icatibant  medication called "Deucrictibant". She has been sent information to participate in a trial and would like to know if this would be appropriate.     She has used Danazol  for pre and post dental work and reports that this caused her to become enraged, but controllable.        Current Outpatient Medications   Medication Sig    berotralstat (ORLADEYO ) 150 mg Oral Capsule Take 150 mg by mouth Once a day    danazoL  (DANOCRINE ) 200 mg Oral  Capsule Take 2 Capsules (400 mg total) by mouth Once a day for 7 days Start 5 days prior to any surgical procedure and continue for 2 days after    doxycycline 100 mg Oral Tablet Take 1 Tablet by mouth Twice daily    EPINEPHrine (EPIPEN) 0.3 mg/0.3 mL Injection Auto-Injector Inject 0.3 mL (0.3 mg total) into the muscle Once, as needed for ANAPHYLAXIS    famotidine (PEPCID) 20 mg Oral Tablet Take 1 Tablet (20 mg total) by mouth Twice daily    fexofenadine (ALLEGRA) 60 mg Oral Tablet Take 1 Tablet (60 mg total) by mouth Once a day    icatibant  30 mg/3 mL Subcutaneous Syringe Inject 1 syringe (30 mg total) under the skin Every 6 hours as needed (angioedema flare) do not exceed 3 doses in a 24 hour period    levothyroxine (SYNTHROID) 50 mcg Oral Tablet Take 1 Tablet (50 mcg total) by mouth Every morning    pantoprazole  (PROTONIX ) 20 mg Oral Tablet, Delayed Release (E.C.) Take 1 Tablet (20 mg total) by mouth Every morning before breakfast for 180 days    vitamin B complex Oral Tablet Take 1 Tablet by mouth Once a day     Allergies   Allergen Reactions    Keflet [Cephalexin] Rash    Ace Inhibitors      Has HAE type I.  Increased risk of Angioedema if on ACE inhibitors.  Has never had angioedema or allergic reaction to any ACE-inhibitor     Advil [Ibuprofen]     Amoxicillin     Benadryl [Diphenhydramine Hcl]      GIVES OPPOSITE REACTION    Benzocaine-Sulfur     Ciprofloxacin     Clindamycin     Erythromycin     Levaquin [Levofloxacin]     Penicillins     Sulfa (Sulfonamides) Stevens-Johnson Syndrome     Past Medical History:   Diagnosis Date    Allergic rhinitis     Benign paroxysmal positional vertigo     Diverticulosis of colon     Drug allergy     Fibroadenoma of breast, left     Food allergy     Frequent headaches     Hashimoto's thyroiditis     History of chronic sinusitis     Mast cell activation syndrome (CMS HCC)     Other chronic allergic conjunctivitis     Otitis media     Pneumonia     Raynaud's syndrome      Sleep apnea     Stevens-Johnson disease (CMS HCC)     Thyroid disease        Past Surgical History:   Procedure Laterality Date    BREAST SURGERY  2021    TO REMOVE ABCESS/CELLULITIS    HX BREAST BIOPSY      3 BREAST BIOPSIES-FIBRO EDENOMA    HX DILATION AND CURETTAGE  1982    MISCARRIAGE    HX TONSILLECTOMY  1960    TROUBLE BREATHING    NASAL TURBINATE REDUCTION  2015    BY ABLATION- TROUBLE BREATHING       Family Medical History:       Problem Relation (Age of Onset)    Allergic rhinitis Sister, Brother    Allergy Mother    Arthritis-rheumatoid Father    COLITIS Brother    Conjunctivitis Mother    Drug Allergy Mother    Food Allergy Mother, Sister, Brother    Heart Disease Mother, Father    Hypertension (High Blood Pressure) Mother, Father    Psoriasis Father    Sinusitis Mother    Swallowing difficulties Mother    Thyroid Disease Sister            Social History     Socioeconomic History    Marital status: Married    Number of children: 5    Years of education: 14   Occupational History    Occupation: RETIRED   Tobacco Use    Smoking status: Former     Types: Cigarettes    Smokeless tobacco: Never   Substance and Sexual Activity    Alcohol use: Yes     Comment: OCCASIONALLY    Drug use: Never       Review of Systems:  Constitutional: Denies fevers, chills, or fatigue.   Eyes: Denies irritation, erythema, discharge or pain.   Ears: Denies any ear pain/tugging or discharge.  Nose: Denies nasal congestion, purulent rhinorrhea,  nose bleeds   Mouth/Throat: Denies any oral ulcers or other lesions.   Cardiovascular: Denies any cyanosis, murmurs, periorbital edema   Respiratory: Denies any shortness of breath, wheezing, cough   GI: Denies any abdominal pain, vomiting, diarrhea or constipation.   Skin: Denies: skin rashes or lesions    Observational Exam:   General: No acute distress  Eyes: EOMI, Conjunctiva are clear. No discharge or icterus noted.  HENT: Mucous membranes are moist. External Nares are  without crusted  rhinorrhea  Lungs: symmetrical Chest rise b/l  Cardiovascular:  No cyanosis.   Skin: no rashes or lesions on face, neck lower arms or hands  Neurologic: grossly normal.  Psychiatric: Affect within normal limits.    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 Tolerance is well if taking with large meals.      Continue Icatibant  rescue every 6 hours as needed up to 3 doses in a 24 hour period.     Discussed Deucrictibant    Follow-up in 6 months or sooner if needed    I am scribing for, and in the presence of, Lorrin Rotter, DO, for services provided on 11/24/2023.  Gabriella Johnson, SCRIBE  11/24/2023, 08:10      I personally performed the services described in this documentation, as scribed  in my presence, and it is both accurate and complete.    Also reviewed Danazol  as a pre and post op medication for minor procedures. And Berinert for Major surgeries.     Follow-up in 6 months or sooner if needed    Dock Baccam P. Wenona Hamilton, PhD  Associate Professor  Adult and Pediatric Allergy and Immunology     Lorrin Rotter, DO 11/30/2023, 15:54       Portions of this note may be dictated using voice recognition software. Variances in spelling and vocabulary are possible and unintentional. Not all errors are caught/corrected. Please notify the [facility] if any discrepancies are noted or if the meaning of any statement is not clear.

## 2023-11-27 ENCOUNTER — Other Ambulatory Visit: Payer: Self-pay

## 2023-11-28 ENCOUNTER — Other Ambulatory Visit: Payer: Self-pay

## 2023-12-12 ENCOUNTER — Other Ambulatory Visit: Payer: Self-pay

## 2024-02-13 ENCOUNTER — Other Ambulatory Visit: Payer: Self-pay

## 2024-02-18 ENCOUNTER — Other Ambulatory Visit: Payer: Self-pay

## 2024-02-18 NOTE — Telephone Encounter (Signed)
 Specialty Pharmacy Follow Up Note    Date of assessment: 02/18/2024  Patient: Catherine Pruitt is a 68 y.o. female  Diagnosis: HAE  Specialty Medication: Icatibant  30 mg SQ Q6 hours PRN for HAE flare; do not exceed 3 doses in 24 hour period  Prescriber: Lorrin Rotter, DO     Subjective:  Spoke with patient for semi-annual follow-up regarding icatibant .    Disease state: HAE:  Symptom report: reports swelling around her eyes, in her knees, and swelling in her larynx. States this does not cause any issues to her breathing. Reports this last flare started yesterday. States she also gets some brain fog during flares as well.    Flares in the past month: Yes  Hospitalization for HAE flares in the past month: No  Triggers: stress, heat, grass, traveling, certain food, lack of sleep  Patient has 2 unexpired on-demand therapies on hand: Yes, Oreadeyo   Prophylaxis therapy: Yes,   Consider all the ways illness and health conditions affect you at this time, please provide a general score to indicate how you're doing (0=very well, 10=very poor): 3/10  Missed days from work, school or planned activities due to disease in the past 4 weeks: Yes  Patient reported goal includes: control of flares   Side Effects: reports some fatigue after injection. Also reports some bruising at injection site.     Adherence: Patient took last dose today and has 2 syringes left on hand.   Medication list reported changes: none     Objective:  Pertinent labs and monitoring parameters include:   None     Assessment/Plan:  Disease state: medication appropriate to continue with therapeutic goal of: preventing hospitalizations and reducing exacerbations. Patient declined review of medication.   Side effects: experiencing but tolerable continue to monitor   Adherence: PRN medication, not applicable. Reminded patient to call into pharmacy when she has 2 syringes remaining.   Medication list: reviewed with patient; up-to-date as of today  Notable  drug-interactions or contraindications: none.  Scheduled delivery of icatibant  for 6/19. Scheduled pharmacist follow up for 6 months or sooner as needed.     Patient expressed no additional questions and was encouraged to contact [facility] with any questions or concerns.     Lindell Revel, [facility] 02/18/2024, 09:43

## 2024-03-11 ENCOUNTER — Other Ambulatory Visit: Payer: Self-pay

## 2024-03-17 ENCOUNTER — Other Ambulatory Visit (HOSPITAL_BASED_OUTPATIENT_CLINIC_OR_DEPARTMENT_OTHER): Payer: Self-pay | Admitting: Pediatric Allergy/Immunology

## 2024-03-17 ENCOUNTER — Other Ambulatory Visit: Payer: Self-pay

## 2024-03-17 NOTE — Telephone Encounter (Signed)
 Refill Request: [facility] All[facility]/Immunology    Pharmacy requests refill of icatibant .    Last visit at [facility]:  01/07/2023  2.   Last My Telemed/ My Chart Visit: 11/24/2023 Buster Redell Dover, DO     3.  Next pending visit [facility]: 05/27/2024  4.  Was last visit within the past year: yes  5.  If not seen within the last year was appointment scheduled: n/a    6.  Requested Pharmacy: as pended     Medication pended to provider for approval.    Aleck Edison, RN 03/17/2024, 15:45

## 2024-03-18 ENCOUNTER — Other Ambulatory Visit: Payer: Self-pay

## 2024-03-18 MED ORDER — ICATIBANT 30 MG/3 ML SUBCUTANEOUS SYRINGE
1.0000 | INJECTION | Freq: Four times a day (QID) | SUBCUTANEOUS | 5 refills | Status: DC | PRN
  Filled 2024-03-18: qty 9, 1d supply, fill #0
  Filled 2024-04-05: qty 18, 2d supply, fill #0
  Filled 2024-04-22: qty 18, 2d supply, fill #1
  Filled 2024-08-05: qty 18, 2d supply, fill #2

## 2024-03-22 ENCOUNTER — Other Ambulatory Visit: Payer: Self-pay

## 2024-03-23 ENCOUNTER — Other Ambulatory Visit: Payer: Self-pay

## 2024-04-05 ENCOUNTER — Other Ambulatory Visit: Payer: Self-pay

## 2024-04-07 ENCOUNTER — Other Ambulatory Visit: Payer: Self-pay

## 2024-04-12 ENCOUNTER — Other Ambulatory Visit: Payer: Self-pay

## 2024-04-13 ENCOUNTER — Other Ambulatory Visit: Payer: Self-pay

## 2024-04-20 ENCOUNTER — Encounter (HOSPITAL_BASED_OUTPATIENT_CLINIC_OR_DEPARTMENT_OTHER): Payer: Self-pay | Admitting: Pediatric Allergy/Immunology

## 2024-04-20 ENCOUNTER — Other Ambulatory Visit (HOSPITAL_BASED_OUTPATIENT_CLINIC_OR_DEPARTMENT_OTHER): Payer: Self-pay | Admitting: Pediatric Allergy/Immunology

## 2024-04-20 NOTE — Telephone Encounter (Signed)
 Refill Request: [facility]  Pharmacy requests refill of danazol .    1.  Last visit at [facility]:  01/07/2023  2.  Last My Telemed/ My Chart Visit: 11/24/2023 Buster Redell Dover, DO   3.  Next pending visit [facility]: 05/27/2024  4.  Was last visit within the past year: yes  5.  If not seen within the last year was appointment scheduled: na  6.  Requested Pharmacy: [facility]    Medication pended to provider for approval.    Harlene Chang, RN 04/20/2024, 12:31

## 2024-04-21 ENCOUNTER — Other Ambulatory Visit: Payer: Self-pay

## 2024-04-21 MED ORDER — DANAZOL 200 MG CAPSULE
400.0000 mg | ORAL_CAPSULE | Freq: Every day | ORAL | 1 refills | Status: AC
Start: 2024-04-21 — End: 2024-05-27
  Filled 2024-04-21 – 2024-05-11 (×2): qty 14, 7d supply, fill #0

## 2024-04-22 ENCOUNTER — Other Ambulatory Visit: Payer: Self-pay

## 2024-04-26 ENCOUNTER — Other Ambulatory Visit: Payer: Self-pay

## 2024-05-06 ENCOUNTER — Other Ambulatory Visit: Payer: Self-pay

## 2024-05-11 ENCOUNTER — Other Ambulatory Visit: Payer: Self-pay

## 2024-05-12 ENCOUNTER — Other Ambulatory Visit: Payer: Self-pay

## 2024-05-24 ENCOUNTER — Other Ambulatory Visit: Payer: Self-pay

## 2024-05-27 ENCOUNTER — Other Ambulatory Visit: Payer: Self-pay

## 2024-05-27 ENCOUNTER — Encounter (HOSPITAL_BASED_OUTPATIENT_CLINIC_OR_DEPARTMENT_OTHER): Payer: Self-pay | Admitting: Pediatric Allergy/Immunology

## 2024-05-27 ENCOUNTER — Ambulatory Visit: Payer: Self-pay | Attending: Pediatric Allergy/Immunology | Admitting: Pediatric Allergy/Immunology

## 2024-05-27 VITALS — BP 152/90 | HR 74 | Temp 97.4°F | Resp 20 | Ht 66.0 in | Wt 192.9 lb

## 2024-05-27 DIAGNOSIS — D841 Defects in the complement system: Secondary | ICD-10-CM | POA: Insufficient documentation

## 2024-05-27 MED ORDER — BERINERT 500 UNIT (10 ML) INTRAVENOUS KIT
1500.0000 [IU] | PACK | Freq: Every day | INTRAVENOUS | 3 refills | Status: AC | PRN
Start: 2024-05-27 — End: ?
  Filled 2024-05-28: qty 120, 40d supply, fill #0

## 2024-05-27 NOTE — Progress Notes (Addendum)
 ALLERGY/IMMUNOLOGY, [facility]  [address]  [place] [address]  Office Visit    Name: Catherine Pruitt   DOB: 17-Sep-1955  Date of Service: 05/27/2024    Chief Complaint(s):     Cc: Follow Up   History provided by pt.    LOV: 11/2023    HPI: Patient is a 68 y.o. female here today with complaints of     angioedema  Today:  Danazol  which she uses prior to getting dental work for ppx has dessimated breasts and caused chest pain. Does not like the way it makes her feel per the patients report. Patient notes she is having a lot of dental work done so is having to take it often. Had one dental procedure without danazol  and had to use 4 injections of itcatabant. Would like to explore other options for danazol . Next 1.5 months needs to have a procedure -epidermal cyst removal in breast under general anesthesia    interested in oral rescue medication.    Hx:  Patient states she continues to take Orladeyo  150 mg once daily; denies any issues with negative side effects or medication compliance. She explains that she used to struggle with difficulty breathing and has not had any breathing issues since starting Orladeyo . She has had to use her Icatibant  medication 2-3 times monthly in the interim. This is helpful to treat the pain in her legs and swelling of her tongue when taken as needed. Her tongue becomes so swollen at times at she cannot fit in back into her mouth. Exposure to her known allergenic triggers really exacerbate her symptoms. Within 30 minutes to 1 hours after Icatibant  she starts to feel better. Nami also reports that she has trialed off of her H1 and H2 blockers which caused her to develop a hive flare-up. She has used Danazol  for pre and post dental work and reports that this caused her to become enraged, but controllable. One of her daughters Pleasant who lives in [place] has tested positive for low C1. Her other daughter was negative and her sister was negative.      Allergies:  Allergies[1]        Immunization History   Administered Date(s) Administered    Covid-19 Vaccine,Janssen(J&J) 11/17/2019          PMFSHx:     Past Medical History:   Diagnosis Date    Allergic rhinitis     Benign paroxysmal positional vertigo     Diverticulosis of colon     Drug allergy     Fibroadenoma of breast, left     Food allergy     Frequent headaches     Hashimoto's thyroiditis     History of chronic sinusitis     Mast cell activation syndrome (CMS HCC)     Other chronic allergic conjunctivitis     Otitis media     Pneumonia     Raynaud's syndrome     Sleep apnea     Stevens-Johnson disease (CMS HCC)     Thyroid disease           Past Surgical History:   Procedure Laterality Date    BREAST SURGERY  2021    TO REMOVE ABCESS/CELLULITIS    HX BREAST BIOPSY      3 BREAST BIOPSIES-FIBRO EDENOMA    HX DILATION AND CURETTAGE  1982    MISCARRIAGE    HX TONSILLECTOMY  1960    TROUBLE BREATHING    NASAL TURBINATE REDUCTION  2015    BY ABLATION- TROUBLE BREATHING             Social History[2]   Social History     Social History Narrative    Not on file      Family Medical History:       Problem Relation (Age of Onset)    Allergic rhinitis Sister, Brother    Allergy Mother    Arthritis-rheumatoid Father    COLITIS Brother    Conjunctivitis Mother    Drug Allergy Mother    Food Allergy Mother, Sister, Brother    Heart Disease Mother, Father    Hypertension (High Blood Pressure) Mother, Father    Psoriasis Father    Sinusitis Mother    Swallowing difficulties Mother    Thyroid Disease Sister                REVIEW OF SYSTEMS   Constitutional: Negative for fever, decreased activity, decreased appetite, lethargy, weight change  Skin: Negative for rash, itching, bumps, scaly skin, dry skin  HEENT: Negative for purulent rhinorrhea, sneezing, itchy nose, nose bleed, snoring, ear pain, and sore throat, negative for eye watering, swelling, itching, pain, vision changes, or light sensitivity  Respiratory: Negative for chronic  cough, croup, and wheezing, shortness of breath, exercise intolerance  Cardiovascular: Negative for tachycardia, cyanosis, pallor, or palpitations, swollen ankles, shortness of breath while asleep  Gastrointestinal: Negative for abdominal pain, vomiting, and diarrhea, constipation, hematochezia  Renal: Negative for urinary frequency, dysuria, hematuria  Neurological: Negative for headaches, seizures, tingling, numbness  Glandular System: Negative for cold intolerance, nervousness, hair loss    PHYSICAL EXAM   BP (!) 152/90   Pulse 74   Temp 36.3 C (97.4 F)   Resp 20   Ht 1.676 m (5' 6)   Wt 87.5 kg (192 lb 14.4 oz)   SpO2 98%   BMI 31.14 kg/m        General:  alert, active, in no acute distress    Eyes:  conjunctiva clear   Ears:  TM's normal, external auditory canals normal   Nose:  clear, no discharge   Oral Pharynx:  moist mucous membranes without erythema or exudates   Neck:  no lymphadenopathy   Chest/Pulmonary:  clear to auscultation in all lung fields, no wheezing, crackles or rhonchi, no retractions or nasal flaring   Cardio:  regular rate and rhythm, no murmur   Skin:   warm, no rashes              ASSESSMENT/PLAN       ICD-10-CM    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 Will attempt to obtain [facility] for rescue in future once pt has had breast epidermal cyst resection surgery completed . will be needing pretreatment for this procedure. Is at high risk for reaction to procedure. Will initiate Berinert for pretreatment and rescue as we are stopping danazol  for predental and minor surgery prophylaxis.     -needs to have berinert at center she is having breast cyst removal prior to procedure   -1000 units pretreatment with berinert  -1500 units for rescue per treatment  -1500 units as needed acute angioedema for 4 weeks with 3 refills  sent   Recommend at least 6000  units of Berinert on hand        Follow up: 6 mo or sooner if needed      The patient was seen and discussed with Dr. Redell SQUIBB. Ivon Oelkers who assisted in formulation of the plan and will addend the note as necessary.       Marien Muller, MD  Allergy & Immunology, PGY-4  05/27/2024 16:04       Marien Muller, MD    I have seen and examined the patient on 05/27/2024.  I have reviewed the HPI, PE and A/P with the patient and the A/I fellow Dr. FREDRIK Muller.   I agree with the above without any changes or additions.     Time spent on 05/27/24: 40 minutes, reviewing the patients chart, obtaining HPI, discussions with the patient, writing letters for her HAE, and charting.   Kejuan Bekker P. Buster HAS, PhD  Associate Professor  Adult and Pediatric Allergy and Immunology     Redell Buster, DO 05/27/2024, 17:33       Portions of this note may be dictated using voice recognition software. Variances in spelling and vocabulary are possible and unintentional. Not all errors are caught/corrected. Please notify the dino if any discrepancies are noted or if the meaning of any statement is not clear.            [1]   Allergies  Allergen Reactions    Keflet [Cephalexin] Rash    Ace Inhibitors      Has HAE type I.  Increased risk of Angioedema if on ACE inhibitors.  Has never had angioedema or allergic reaction to any ACE-inhibitor     Advil [Ibuprofen]     Amoxicillin     Benadryl [Diphenhydramine Hcl]      GIVES OPPOSITE REACTION    Benzocaine-Sulfur     Ciprofloxacin     Clindamycin     Erythromycin     Levaquin [Levofloxacin]     Penicillins     Sulfa (Sulfonamides) Stevens-Johnson Syndrome   [2]   Social History  Tobacco Use    Smoking status: Former     Types: Cigarettes    Smokeless tobacco: Never   Substance Use Topics    Alcohol use: Yes     Comment: OCCASIONALLY    Drug use: Never

## 2024-05-27 NOTE — Patient Instructions (Signed)
-  postpone dental procedure until berinert on hand

## 2024-05-28 ENCOUNTER — Other Ambulatory Visit: Payer: Self-pay

## 2024-05-31 ENCOUNTER — Ambulatory Visit (HOSPITAL_BASED_OUTPATIENT_CLINIC_OR_DEPARTMENT_OTHER): Payer: Self-pay | Admitting: Pediatric Allergy/Immunology

## 2024-05-31 NOTE — Telephone Encounter (Signed)
 Latest office note and face sheet faxed to [facility] at [phone].  Harlene Chang, RN

## 2024-06-01 ENCOUNTER — Other Ambulatory Visit: Payer: Self-pay

## 2024-06-06 ENCOUNTER — Encounter (HOSPITAL_BASED_OUTPATIENT_CLINIC_OR_DEPARTMENT_OTHER): Payer: Self-pay | Admitting: Pediatric Allergy/Immunology

## 2024-06-08 ENCOUNTER — Other Ambulatory Visit: Payer: Self-pay

## 2024-06-09 ENCOUNTER — Encounter (HOSPITAL_BASED_OUTPATIENT_CLINIC_OR_DEPARTMENT_OTHER): Payer: Self-pay | Admitting: Pediatric Allergy/Immunology

## 2024-06-09 NOTE — Telephone Encounter (Signed)
 Completed and faxed Berinert prescription and service request form. Faxed to [phone]. Uploaded form into chart.     Chiquita Solomons, RN

## 2024-06-10 ENCOUNTER — Other Ambulatory Visit: Payer: Self-pay

## 2024-06-15 ENCOUNTER — Ambulatory Visit (HOSPITAL_BASED_OUTPATIENT_CLINIC_OR_DEPARTMENT_OTHER): Payer: Self-pay | Admitting: Pediatric Allergy/Immunology

## 2024-06-15 NOTE — Telephone Encounter (Signed)
 Received orders for berinert via fax from [facility]. Orders confirmed, signed, then faxed back to [facility] successfully.   Ethelmae Ringel, RN

## 2024-06-16 ENCOUNTER — Other Ambulatory Visit: Payer: Self-pay

## 2024-06-17 ENCOUNTER — Other Ambulatory Visit: Payer: Self-pay

## 2024-06-22 ENCOUNTER — Ambulatory Visit (HOSPITAL_BASED_OUTPATIENT_CLINIC_OR_DEPARTMENT_OTHER): Payer: Self-pay | Admitting: Pediatric Allergy/Immunology

## 2024-06-22 NOTE — Telephone Encounter (Signed)
 Prior authorization form for Berinert completed and faxed with clinicals to [facility] successfully.   Lot Medford, RN

## 2024-07-01 ENCOUNTER — Other Ambulatory Visit: Payer: Self-pay

## 2024-07-06 ENCOUNTER — Ambulatory Visit (HOSPITAL_BASED_OUTPATIENT_CLINIC_OR_DEPARTMENT_OTHER): Payer: Self-pay | Admitting: Pediatric Allergy/Immunology

## 2024-07-06 NOTE — Telephone Encounter (Signed)
-----   Message from [place] M sent at 06/25/2024 12:00 PM EDT -----  [facility] called in, wanting to know if we had received the prior auth for Berinert.     Phone # is [phone]    Thank you.  Tawni Bradley [employer], [place].

## 2024-07-06 NOTE — Telephone Encounter (Signed)
 Received fax memo from [facility]. Per memo, prior auth for Berinert is not required; however, due to the cost of medication the pharmacy may need to contact coverage review department to request override.     Faxed necessary documents to [facility]    Chiquita Solomons, RN

## 2024-07-08 ENCOUNTER — Other Ambulatory Visit: Payer: Self-pay

## 2024-07-16 ENCOUNTER — Other Ambulatory Visit: Payer: Self-pay

## 2024-07-23 ENCOUNTER — Other Ambulatory Visit: Payer: Self-pay

## 2024-07-27 ENCOUNTER — Other Ambulatory Visit: Payer: Self-pay

## 2024-08-04 ENCOUNTER — Other Ambulatory Visit: Payer: Self-pay

## 2024-08-05 ENCOUNTER — Other Ambulatory Visit: Payer: Self-pay

## 2024-08-09 ENCOUNTER — Other Ambulatory Visit: Payer: Self-pay

## 2024-08-10 ENCOUNTER — Other Ambulatory Visit: Payer: Self-pay

## 2024-09-06 ENCOUNTER — Other Ambulatory Visit: Payer: Self-pay

## 2024-09-06 ENCOUNTER — Ambulatory Visit (HOSPITAL_BASED_OUTPATIENT_CLINIC_OR_DEPARTMENT_OTHER): Payer: Self-pay | Admitting: Pediatric Allergy/Immunology

## 2024-09-06 NOTE — Telephone Encounter (Signed)
 Regarding: Clinical Question  ----- Message from Clancy PEDLAR sent at 09/06/2024 11:38 AM EST -----  Copied From CRM [phone].  [facility]) (Self) called with a clinical question.   Having in office procedure and has a few questions about what precautions need taken with pt, please call back. Thank you

## 2024-09-06 NOTE — Nursing Note (Signed)
 Attempted to call Jasmine back from [facility]. Front desk answered call but Rolin was unavailable. They stated she would call back when she was able to.     Marry Pool, RN

## 2024-09-07 ENCOUNTER — Ambulatory Visit (HOSPITAL_BASED_OUTPATIENT_CLINIC_OR_DEPARTMENT_OTHER): Payer: Self-pay | Admitting: Pediatric Allergy/Immunology

## 2024-09-07 NOTE — Nursing Note (Signed)
 Spoke with Catherine Pruitt regarding if patient can have cyst on right breast removed due to her angioedema issues and medications. Read Catherine Pruitt Dr. Buster progress note from 05/27/24. I stated,  Will attempt to obtain [facility] for rescue in future once pt has had breast epidermal cyst resection surgery completed . will be needing pretreatment for this procedure. Is at high risk for reaction to procedure. Will initiate Berinert  for pretreatment and rescue as we are stopping danazol  for predental and minor surgery prophylaxis. Needs to have berinert  at center she is having breast cyst removal prior to procedure. Catherine Pruitt acknowledged that patient does need medication to perform procedure.     Marry Pool, RN

## 2024-09-07 NOTE — Telephone Encounter (Signed)
 Regarding: Returning Call  ----- Message from Lauraine RAMAN sent at 09/06/2024  4:18 PM EST -----  Copied From CRM [phone].[facility] (other) is returning a call they received from the staff

## 2024-09-10 ENCOUNTER — Other Ambulatory Visit: Payer: Self-pay

## 2024-09-10 ENCOUNTER — Other Ambulatory Visit (HOSPITAL_BASED_OUTPATIENT_CLINIC_OR_DEPARTMENT_OTHER): Payer: Self-pay | Admitting: Pediatric Allergy/Immunology

## 2024-09-10 MED ORDER — ICATIBANT 30 MG/3 ML SUBCUTANEOUS SYRINGE
1.0000 | INJECTION | Freq: Four times a day (QID) | SUBCUTANEOUS | 5 refills | Status: AC | PRN
  Filled 2024-09-13: qty 18, 2d supply, fill #0

## 2024-09-13 ENCOUNTER — Other Ambulatory Visit: Payer: Self-pay

## 2024-09-14 ENCOUNTER — Other Ambulatory Visit: Payer: Self-pay

## 2024-09-14 ENCOUNTER — Other Ambulatory Visit: Payer: Self-pay | Admitting: Pharmacist

## 2024-09-14 NOTE — Telephone Encounter (Signed)
 Specialty Pharmacy Follow Up Note    Date of assessment: 09/14/2024  Patient: Catherine Pruitt is a 69 y.o. female  Diagnosis: HAE Type 1   Specialty Medication: icatibant  30 mg/3 ml: Inject 1 syringe (30 mg total) under the skin Every 6 hours as needed (angioedema flare) do not exceed 3 doses in a 24 hour period  Prescriber: Buster Redell Dover, DO       Subjective:  Spoke with patient for semi annual follow-up regarding icatibant .    Disease state: HAE:  Symptom report: last flare was 5 days ago and required three doses of icatibant  to resolve. States she had diverticulitis and thinks that is what caused the flare. Reports she experienced her normal flare symptoms.   Attacks in the past month: Yes  Triggers: illness, stress, heat, grass, traveling, certain foods, and lack of sleep  Hospitalization for HAE in the past month: No  Patient has 2 unexpired on-demand therapies on hand: Yes; 3 syringes on hand  Prophylactic therapy: Orladeyo   Missed days from work, school or planned activities due to disease in the past 4 weeks: No  How confident are you in taking this medication? extremely confident  How confident are you in managing the side effects of this medication? extremely confident  Patient reported goal includes: control flares   Side Effects: none    Adherence: Patient took last dose 1/8 and has 3 syringes left on hand.   Medication list reported changes: discontinued doxycycline  She is currently administering her doses in her abdomen, rotating injection sites each time.  She has a sharps container for disposal of used syringes.     Objective:  Pertinent labs and monitoring parameters include:   Lab monitoring not required.     Assessment/Plan:  Disease state: medication appropriate to continue with therapeutic goal of rapid relief of symptoms from acute, episodic attack of HAE.  Reviewed medication purpose, administration, storage/handling, when to contact the pharmacy, when to contact the clinic, when  to seek appropriate care, and disease state.   Side effects: none continue to monitor   Adherence: Using as needed for flares  Medication list: reviewed with patient; up-to-date as of today  Notable drug-interactions or contraindications: none.  Scheduled delivery of icatibant  for Thursday 1/15. Scheduled pharmacist follow up for 6 months or sooner as needed.   Reminded of follow-up appointment 1/29.     Patient expressed no additional questions and was encouraged to contact [facility] with any questions or concerns.     Milana Rummer, Ms [facility] 09/14/2024, 13:42

## 2024-09-15 ENCOUNTER — Other Ambulatory Visit: Payer: Self-pay

## 2024-09-30 ENCOUNTER — Ambulatory Visit: Payer: Self-pay | Admitting: Pediatric Allergy/Immunology

## 2024-12-14 ENCOUNTER — Ambulatory Visit (HOSPITAL_BASED_OUTPATIENT_CLINIC_OR_DEPARTMENT_OTHER): Payer: Self-pay | Admitting: Pediatric Allergy/Immunology

## 2024-12-21 ENCOUNTER — Ambulatory Visit (HOSPITAL_BASED_OUTPATIENT_CLINIC_OR_DEPARTMENT_OTHER): Admitting: Pediatric Allergy/Immunology
